# Patient Record
Sex: Male | Born: 1975 | Race: White | Hispanic: No | Smoking: Former smoker
Health system: Southern US, Community
[De-identification: ages and names within clinical notes are randomized; demographics above are authoritative.]

## PROBLEM LIST (undated history)

## (undated) DIAGNOSIS — G4733 Obstructive sleep apnea (adult) (pediatric): Secondary | ICD-10-CM

## (undated) DIAGNOSIS — F4310 Post-traumatic stress disorder, unspecified: Secondary | ICD-10-CM

## (undated) DIAGNOSIS — F419 Anxiety disorder, unspecified: Secondary | ICD-10-CM

## (undated) DIAGNOSIS — G8929 Other chronic pain: Secondary | ICD-10-CM

## (undated) DIAGNOSIS — M1990 Unspecified osteoarthritis, unspecified site: Secondary | ICD-10-CM

## (undated) DIAGNOSIS — T148XXA Other injury of unspecified body region, initial encounter: Secondary | ICD-10-CM

## (undated) HISTORY — DX: Obstructive sleep apnea (adult) (pediatric): G47.33

## (undated) HISTORY — PX: APPENDECTOMY: SHX54

## (undated) HISTORY — PX: VASECTOMY: SHX75

---

## 2003-06-10 ENCOUNTER — Emergency Department (HOSPITAL_COMMUNITY): Admission: EM | Admit: 2003-06-10 | Discharge: 2003-06-10 | Payer: Self-pay | Admitting: Emergency Medicine

## 2008-02-18 ENCOUNTER — Emergency Department (HOSPITAL_COMMUNITY): Admission: EM | Admit: 2008-02-18 | Discharge: 2008-02-18 | Payer: Self-pay | Admitting: Emergency Medicine

## 2008-02-25 ENCOUNTER — Emergency Department (HOSPITAL_COMMUNITY): Admission: EM | Admit: 2008-02-25 | Discharge: 2008-02-25 | Payer: Self-pay | Admitting: Emergency Medicine

## 2008-02-29 ENCOUNTER — Emergency Department (HOSPITAL_COMMUNITY): Admission: EM | Admit: 2008-02-29 | Discharge: 2008-02-29 | Payer: Self-pay | Admitting: Emergency Medicine

## 2008-11-22 ENCOUNTER — Emergency Department (HOSPITAL_COMMUNITY): Admission: EM | Admit: 2008-11-22 | Discharge: 2008-11-22 | Payer: Self-pay | Admitting: Emergency Medicine

## 2010-10-16 LAB — COMPREHENSIVE METABOLIC PANEL
ALT: 13 U/L (ref 0–53)
AST: 20 U/L (ref 0–37)
Albumin: 3.9 g/dL (ref 3.5–5.2)
Alkaline Phosphatase: 63 U/L (ref 39–117)
BUN: 14 mg/dL (ref 6–23)
CO2: 22 mEq/L (ref 19–32)
Calcium: 8.9 mg/dL (ref 8.4–10.5)
Chloride: 110 mEq/L (ref 96–112)
Creatinine, Ser: 0.98 mg/dL (ref 0.4–1.5)
GFR calc Af Amer: >60 mL/min (ref >60)
GFR calc non Af Amer: >60 mL/min (ref >60)
Glucose, Bld: 135 mg/dL — ABNORMAL HIGH (ref 70–99)
Potassium: 4 mEq/L (ref 3.5–5.1)
Sodium: 140 mEq/L (ref 135–145)
Total Bilirubin: 0.6 mg/dL (ref 0.3–1.2)
Total Protein: 7.1 g/dL (ref 6.0–8.3)

## 2010-10-16 LAB — CBC
HCT: 48.4 % (ref 39.0–52.0)
Hemoglobin: 16.5 g/dL (ref 13.0–17.0)
MCHC: 34.1 g/dL (ref 30.0–36.0)
MCV: 89.2 fL (ref 78.0–100.0)
Platelets: 347 10*3/uL (ref 150–400)
RBC: 5.43 MIL/uL (ref 4.22–5.81)
RDW: 13 % (ref 11.5–15.5)
WBC: 18.6 10*3/uL — ABNORMAL HIGH (ref 4.0–10.5)

## 2010-10-16 LAB — DIFFERENTIAL
Basophils Absolute: 0 10*3/uL (ref 0.0–0.1)
Basophils Relative: 0 % (ref 0–1)
Eosinophils Absolute: 0 10*3/uL (ref 0.0–0.7)
Eosinophils Relative: 0 % (ref 0–5)
Lymphocytes Relative: 8 % — ABNORMAL LOW (ref 12–46)
Lymphs Abs: 1.5 10*3/uL (ref 0.7–4.0)
Monocytes Absolute: 0.3 10*3/uL (ref 0.1–1.0)
Monocytes Relative: 2 % — ABNORMAL LOW (ref 3–12)
Neutro Abs: 16.8 10*3/uL — ABNORMAL HIGH (ref 1.7–7.7)
Neutrophils Relative %: 90 % — ABNORMAL HIGH (ref 43–77)

## 2010-10-16 LAB — LIPASE, BLOOD: Lipase: 18 U/L (ref 11–59)

## 2013-03-02 ENCOUNTER — Encounter (HOSPITAL_COMMUNITY): Payer: Self-pay | Admitting: Emergency Medicine

## 2013-03-02 ENCOUNTER — Emergency Department (HOSPITAL_COMMUNITY)
Admission: EM | Admit: 2013-03-02 | Discharge: 2013-03-02 | Disposition: A | Discharge status: Home / Self Care | Attending: Emergency Medicine | Admitting: Emergency Medicine

## 2013-03-02 DIAGNOSIS — H6690 Otitis media, unspecified, unspecified ear: Secondary | ICD-10-CM | POA: Insufficient documentation

## 2013-03-02 DIAGNOSIS — Z79899 Other long term (current) drug therapy: Secondary | ICD-10-CM | POA: Insufficient documentation

## 2013-03-02 DIAGNOSIS — Z87891 Personal history of nicotine dependence: Secondary | ICD-10-CM | POA: Insufficient documentation

## 2013-03-02 DIAGNOSIS — F4310 Post-traumatic stress disorder, unspecified: Secondary | ICD-10-CM | POA: Insufficient documentation

## 2013-03-02 DIAGNOSIS — Z8739 Personal history of other diseases of the musculoskeletal system and connective tissue: Secondary | ICD-10-CM | POA: Insufficient documentation

## 2013-03-02 DIAGNOSIS — H6692 Otitis media, unspecified, left ear: Secondary | ICD-10-CM

## 2013-03-02 DIAGNOSIS — H9190 Unspecified hearing loss, unspecified ear: Secondary | ICD-10-CM | POA: Insufficient documentation

## 2013-03-02 HISTORY — DX: Unspecified osteoarthritis, unspecified site: M19.90

## 2013-03-02 HISTORY — DX: Post-traumatic stress disorder, unspecified: F43.10

## 2013-03-02 MED ORDER — AMOXICILLIN 500 MG PO CAPS
1000.0000 mg | ORAL_CAPSULE | Freq: Once | ORAL | Status: AC
  Administered 2013-03-02: 1000 mg via ORAL
  Filled 2013-03-02: qty 2

## 2013-03-02 MED ORDER — ANTIPYRINE-BENZOCAINE 5.4-1.4 % OT SOLN
3.0000 [drp] | Freq: Once | OTIC | Status: AC
  Administered 2013-03-02: 4 [drp] via OTIC
  Filled 2013-03-02: qty 10

## 2013-03-02 MED ORDER — AMOXICILLIN 500 MG PO CAPS: 1000.0000 mg | ORAL_CAPSULE | Freq: Two times a day (BID) | ORAL | Status: DC

## 2013-03-02 NOTE — ED Notes (Signed)
Patient states he woke up this morning and was having pressure behind bilateral ears.   Patient states that "I'm not hearing right.  I can't hear you good right now".   Patient has service dog at bedside with him.

## 2013-03-02 NOTE — ED Provider Notes (Signed)
CSN: [id number]     Arrival date & time 03/02/13  4696 History   First MD Initiated Contact with Patient 03/02/13 [phone]     Chief Complaint  Patient presents with  . Otalgia   (Consider location/radiation/quality/duration/timing/severity/associated sxs/prior Treatment) Patient is a 37 y.o. male presenting with ear pain. The history is provided by the patient. No language interpreter was used.  Otalgia Location:  Bilateral Severity:  Moderate Associated symptoms: hearing loss   Associated symptoms: no abdominal pain, no cough, no fever, no headaches and no rash   Associated symptoms comment:  Bilateral ear pain, worse on left, for several days. No known fever at home. He does not have any difficulty swallowing or significant sore throat. He complains of mild nasal and sinus congestion and muffled hearing. He is dizzy when he walks and feels off balance. There has been no drainage or bleeding from the ears.    Past Medical History  Diagnosis Date  . Arthritis   . PTSD (post-traumatic stress disorder)    History reviewed. No pertinent past surgical history. No family history on file. History  Substance Use Topics  . Smoking status: Former Games developer  . Smokeless tobacco: Not on file  . Alcohol Use: No    Review of Systems  Constitutional: Negative for fever.  HENT: Positive for hearing loss and ear pain.   Respiratory: Negative for cough.   Gastrointestinal: Negative for nausea and abdominal pain.  Musculoskeletal: Negative for myalgias.  Skin: Negative for rash.  Neurological: Negative for headaches.  Psychiatric/Behavioral: Negative for confusion.    Allergies  Benadryl  Home Medications   Current Outpatient Rx  Name  Route  Sig  Dispense  Refill  . ALPRAZolam (XANAX) 1 MG tablet   Oral   Take 1 mg by mouth 2 (two) times daily as needed for sleep or anxiety.         Marland Kitchen buPROPion (WELLBUTRIN SR) 150 MG 12 hr tablet   Oral   Take 150 mg by mouth 2 (two) times daily.        Marland Kitchen desvenlafaxine (PRISTIQ) 100 MG 24 hr tablet   Oral   Take 200 mg by mouth daily.         Marland Kitchen gabapentin (NEURONTIN) 600 MG tablet   Oral   Take 600 mg by mouth 2 (two) times daily.         Marland Kitchen morphine (MSIR) 30 MG tablet   Oral   Take 30 mg by mouth 3 (three) times daily as needed for pain.         Marland Kitchen zolpidem (AMBIEN) 10 MG tablet   Oral   Take 10 mg by mouth at bedtime.          BP 147/99  Pulse 103  Temp(Src) 98.7 F (37.1 C) (Oral)  Resp 16  Ht 6' (1.829 m)  Wt 160 lb (72.576 kg)  BMI 21.7 kg/m2  SpO2 97% Physical Exam  Constitutional: He appears well-developed and well-nourished.  HENT:  Head: Normocephalic.  Mouth/Throat: Oropharynx is clear and moist.  Left TM significantly erythematous, slight bulging. External canal unremarkable. Right TM and external canal without acute finding.   Neck: Normal range of motion. Neck supple.  Cardiovascular: Normal rate and regular rhythm.   Pulmonary/Chest: Effort normal and breath sounds normal.  Abdominal: Soft. Bowel sounds are normal. There is no tenderness. There is no rebound and no guarding.  Musculoskeletal: Normal range of motion.  Lymphadenopathy:       Head (  right side): No preauricular and no posterior auricular adenopathy present.       Head (left side): No preauricular and no posterior auricular adenopathy present.  Neurological: He is alert. No cranial nerve deficit.  Skin: Skin is warm and dry. No rash noted.  Psychiatric: He has a normal mood and affect.    ED Course  Procedures (including critical care time) Labs Review Labs Reviewed - No data to display Imaging Review No results found.  MDM  No diagnosis found. 1. Otitis media, left  Auralgen given for comfort, patient started on Amoxil. [facility]    Arnoldo Hooker, PA-C 03/05/13 2306

## 2013-03-09 NOTE — ED Provider Notes (Signed)
Medical screening examination/treatment/procedure(s) were performed by non-physician practitioner and as supervising physician I was immediately available for consultation/collaboration.   Laray Anger, DO 03/09/13 1458

## 2013-03-13 ENCOUNTER — Emergency Department (HOSPITAL_COMMUNITY)
Admission: EM | Admit: 2013-03-13 | Discharge: 2013-03-13 | Disposition: A | Discharge status: Home / Self Care | Attending: Emergency Medicine | Admitting: Emergency Medicine

## 2013-03-13 ENCOUNTER — Encounter (HOSPITAL_COMMUNITY): Payer: Self-pay | Admitting: Emergency Medicine

## 2013-03-13 DIAGNOSIS — Z8739 Personal history of other diseases of the musculoskeletal system and connective tissue: Secondary | ICD-10-CM | POA: Insufficient documentation

## 2013-03-13 DIAGNOSIS — Z79899 Other long term (current) drug therapy: Secondary | ICD-10-CM | POA: Insufficient documentation

## 2013-03-13 DIAGNOSIS — H9209 Otalgia, unspecified ear: Secondary | ICD-10-CM | POA: Insufficient documentation

## 2013-03-13 DIAGNOSIS — Z87891 Personal history of nicotine dependence: Secondary | ICD-10-CM | POA: Insufficient documentation

## 2013-03-13 DIAGNOSIS — R000 Tachycardia, unspecified: Secondary | ICD-10-CM | POA: Insufficient documentation

## 2013-03-13 DIAGNOSIS — N342 Other urethritis: Secondary | ICD-10-CM | POA: Insufficient documentation

## 2013-03-13 DIAGNOSIS — Z792 Long term (current) use of antibiotics: Secondary | ICD-10-CM | POA: Insufficient documentation

## 2013-03-13 DIAGNOSIS — F4310 Post-traumatic stress disorder, unspecified: Secondary | ICD-10-CM | POA: Insufficient documentation

## 2013-03-13 DIAGNOSIS — J029 Acute pharyngitis, unspecified: Secondary | ICD-10-CM

## 2013-03-13 LAB — URINALYSIS, ROUTINE W REFLEX MICROSCOPIC
Bilirubin Urine: NEGATIVE
Glucose, UA: NEGATIVE mg/dL
Hgb urine dipstick: NEGATIVE
Ketones, ur: NEGATIVE mg/dL
Leukocytes, UA: NEGATIVE
Nitrite: NEGATIVE
Protein, ur: NEGATIVE mg/dL
Specific Gravity, Urine: 1.01 (ref 1.005–1.030)
Urobilinogen, UA: 1 mg/dL (ref 0.0–1.0)
pH: 7.5 (ref 5.0–8.0)

## 2013-03-13 LAB — POCT I-STAT, CHEM 8
BUN: <3 mg/dL — ABNORMAL LOW (ref 6–23)
Calcium, Ion: 1.17 mmol/L (ref 1.12–1.23)
Chloride: 101 mEq/L (ref 96–112)
Creatinine, Ser: 1.2 mg/dL (ref 0.50–1.35)
Glucose, Bld: 119 mg/dL — ABNORMAL HIGH (ref 70–99)
HCT: 46 % (ref 39.0–52.0)
Hemoglobin: 15.6 g/dL (ref 13.0–17.0)
Potassium: 3.5 mEq/L (ref 3.5–5.1)
Sodium: 140 mEq/L (ref 135–145)
TCO2: 24 mmol/L (ref 0–100)

## 2013-03-13 LAB — RAPID STREP SCREEN (MED CTR MEBANE ONLY): Streptococcus, Group A Screen (Direct): NEGATIVE

## 2013-03-13 MED ORDER — AZITHROMYCIN 250 MG PO TABS
1000.0000 mg | ORAL_TABLET | Freq: Once | ORAL | Status: AC
  Administered 2013-03-13: 1000 mg via ORAL
  Filled 2013-03-13: qty 4

## 2013-03-13 MED ORDER — SODIUM CHLORIDE 0.9 % IV BOLUS (SEPSIS)
1000.0000 mL | Freq: Once | INTRAVENOUS | Status: AC
  Administered 2013-03-13: 1000 mL via INTRAVENOUS

## 2013-03-13 MED ORDER — CEFTRIAXONE SODIUM 250 MG IJ SOLR
250.0000 mg | Freq: Once | INTRAMUSCULAR | Status: AC
  Administered 2013-03-13: 250 mg via INTRAMUSCULAR
  Filled 2013-03-13: qty 250

## 2013-03-13 MED ORDER — LIDOCAINE HCL (PF) 1 % IJ SOLN
INTRAMUSCULAR | Status: AC
  Administered 2013-03-13: 0.9 mL
  Filled 2013-03-13: qty 5

## 2013-03-13 NOTE — ED Notes (Addendum)
States has been on Amoxicillan since Aug 26 when he was seen here for inner ear infection. States has swollen glands and right ear still hurts. ALSO -- HAS SERVICE DOG AT BEDSIDE

## 2013-03-13 NOTE — ED Notes (Signed)
Assumed Care. Initial contact. Patient resting with dog at bedside. Appears in NAD. Medicated per MAR.

## 2013-03-13 NOTE — ED Notes (Signed)
Medicated per MAR. Tolerated well.

## 2013-03-13 NOTE — ED Notes (Signed)
Patient was dc by Adrian Prows, RN.

## 2013-03-13 NOTE — ED Provider Notes (Signed)
CSN: [id number]     Arrival date & time 03/13/13  4098 History   First MD Initiated Contact with Patient 03/13/13 [phone]     Chief Complaint  Patient presents with  . Otalgia  . Sore Throat   (Consider location/radiation/quality/duration/timing/severity/associated sxs/prior Treatment) HPI 37 year old male who comes in today complaining of sore throat and right ear pain. He states that these began prior to visit 10 days ago. He states at that time he also had left ear pain. He states that the left ear pain has improved with the antibiotics but he has continued to have right ear pain. He has not had a fever with this. He states his right side of his throat is sore. He denies nausea, vomiting, decreased by mouth intake, difficulty speaking, or swallowing. He also states that he has had some burning when he urinates. He states he does not recall the number of sexual partners he has had in the past year. He denies any definite history of sexually transmitted diseases or urethral discharge. He states he has difficulty voiding to give urine samples. He states he will not be able to void for urine sample here but will require a catheterization Past Medical History  Diagnosis Date  . Arthritis   . PTSD (post-traumatic stress disorder)    History reviewed. No pertinent past surgical history. No family history on file. History  Substance Use Topics  . Smoking status: Former Games developer  . Smokeless tobacco: Not on file  . Alcohol Use: No    Review of Systems  All other systems reviewed and are negative.    Allergies  Review of patient's allergies indicates no active allergies.  Home Medications   Current Outpatient Rx  Name  Route  Sig  Dispense  Refill  . ALPRAZolam (XANAX) 1 MG tablet   Oral   Take 1 mg by mouth 3 (three) times daily as needed for sleep or anxiety.          Marland Kitchen amoxicillin (AMOXIL) 500 MG capsule   Oral   Take 2 capsules (1,000 mg total) by mouth 2 (two) times daily.   40  capsule   0   . buPROPion (WELLBUTRIN SR) 150 MG 12 hr tablet   Oral   Take 150 mg by mouth 2 (two) times daily.         Marland Kitchen desvenlafaxine (PRISTIQ) 100 MG 24 hr tablet   Oral   Take 200 mg by mouth daily.         Marland Kitchen gabapentin (NEURONTIN) 600 MG tablet   Oral   Take 600 mg by mouth 2 (two) times daily.         Marland Kitchen HYDROmorphone (DILAUDID) 2 MG tablet   Oral   Take 2 mg by mouth every 4 (four) hours as needed for pain.         Marland Kitchen morphine (MSIR) 30 MG tablet   Oral   Take 30 mg by mouth 3 (three) times daily as needed for pain.         Marland Kitchen zolpidem (AMBIEN) 10 MG tablet   Oral   Take 10 mg by mouth at bedtime.          BP 155/97  Pulse 110  Temp(Src) 98.7 F (37.1 C) (Oral)  Resp 18  SpO2 98% Physical Exam  Nursing note and vitals reviewed. Constitutional: He is oriented to person, place, and time. He appears well-developed and well-nourished.  HENT:  Head: Normocephalic and atraumatic.  Right Ear:  External ear normal.  Left Ear: External ear normal.  Nose: Nose normal.  Mouth/Throat: Oropharynx is clear and moist.  Eyes: Conjunctivae are normal. Pupils are equal, round, and reactive to light.  Neck: Normal range of motion.  Cardiovascular: Regular rhythm, normal heart sounds and intact distal pulses.  Tachycardia present.   Heart rate 112 by auscultation.  Pulmonary/Chest: Effort normal.  Abdominal: Soft. Bowel sounds are normal.  Genitourinary: Penis normal.  Patient with transurethral bar/piercing in place. No urethral discharge is noted. There is no swelling, warmth, or mass palpable.  Musculoskeletal: Normal range of motion.  Neurological: He is alert and oriented to person, place, and time. He has normal reflexes.  Skin: Skin is warm and dry.  Psychiatric: He has a normal mood and affect. His behavior is normal. Thought content normal.    ED Course  Procedures (including critical care time) Labs Review Labs Reviewed  POCT I-STAT, CHEM 8 -  Abnormal; Notable for the following:    BUN <3 (*)    Glucose, Bld 119 (*)    All other components within normal limits  RAPID STREP SCREEN  GC/CHLAMYDIA PROBE AMP  URINALYSIS, ROUTINE W REFLEX MICROSCOPIC   Imaging Review No results found.  MDM  Patient had a catheterized urine obtained as he states that he has dysuria but is unable to voluntarily give urinary specimen. He states he would be unable to void for urinary specimen unit he was given IV fluids and has always required a catheterization. Patient had lab work checked do to his tachycardia with his symptoms. His creatinine is normal and he does not appear volume depleted by labs or physical exam. The tachycardia has been consistent and he is not hypotensive.  Results for orders placed during the hospital encounter of 03/13/13  URINALYSIS, ROUTINE W REFLEX MICROSCOPIC      Result Value Range   Color, Urine YELLOW  YELLOW   APPearance CLEAR  CLEAR   Specific Gravity, Urine 1.010  1.005 - 1.030   pH 7.5  5.0 - 8.0   Glucose, UA NEGATIVE  NEGATIVE mg/dL   Hgb urine dipstick NEGATIVE  NEGATIVE   Bilirubin Urine NEGATIVE  NEGATIVE   Ketones, ur NEGATIVE  NEGATIVE mg/dL   Protein, ur NEGATIVE  NEGATIVE mg/dL   Urobilinogen, UA 1.0  0.0 - 1.0 mg/dL   Nitrite NEGATIVE  NEGATIVE   Leukocytes, UA NEGATIVE  NEGATIVE  POCT I-STAT, CHEM 8      Result Value Range   Sodium 140  135 - 145 mEq/L   Potassium 3.5  3.5 - 5.1 mEq/L   Chloride 101  96 - 112 mEq/L   BUN <3 (*) 6 - 23 mg/dL   Creatinine, Ser 1.47  0.50 - 1.35 mg/dL   Glucose, Bld 829 (*) 70 - 99 mg/dL   Calcium, Ion 5.62  1.30 - 1.23 mmol/L   TCO2 24  0 - 100 mmol/L   Hemoglobin 15.6  13.0 - 17.0 g/dL   HCT 86.5  78.4 - 69.6 %   Filed Vitals:   03/13/13 1206  BP: 152/95  Pulse: 89  Temp:   Resp: 16     Patient feels better and hr decreased.   1- sore throat/ear pain- He is taking po without difficulty and has normal speech.  No evidence of abscess.. I am waiting  results of strep.  2-urethritis- gc/chlamydia sent andpatient treated with rocephin and zithrommax   Hilario Quarry, MD 03/13/13 1234

## 2013-03-15 LAB — GC/CHLAMYDIA PROBE AMP
CT Probe RNA: NEGATIVE
GC Probe RNA: NEGATIVE

## 2013-03-15 LAB — CULTURE, GROUP A STREP

## 2013-07-26 ENCOUNTER — Emergency Department (HOSPITAL_COMMUNITY)

## 2013-07-26 ENCOUNTER — Emergency Department (HOSPITAL_COMMUNITY)
Admission: EM | Admit: 2013-07-26 | Discharge: 2013-07-26 | Disposition: A | Discharge status: Home / Self Care | Attending: Emergency Medicine | Admitting: Emergency Medicine

## 2013-07-26 ENCOUNTER — Encounter (HOSPITAL_COMMUNITY): Payer: Self-pay | Admitting: Emergency Medicine

## 2013-07-26 DIAGNOSIS — Z8739 Personal history of other diseases of the musculoskeletal system and connective tissue: Secondary | ICD-10-CM | POA: Insufficient documentation

## 2013-07-26 DIAGNOSIS — Z87891 Personal history of nicotine dependence: Secondary | ICD-10-CM | POA: Insufficient documentation

## 2013-07-26 DIAGNOSIS — S90129A Contusion of unspecified lesser toe(s) without damage to nail, initial encounter: Secondary | ICD-10-CM | POA: Insufficient documentation

## 2013-07-26 DIAGNOSIS — Z79899 Other long term (current) drug therapy: Secondary | ICD-10-CM | POA: Insufficient documentation

## 2013-07-26 DIAGNOSIS — W208XXA Other cause of strike by thrown, projected or falling object, initial encounter: Secondary | ICD-10-CM | POA: Insufficient documentation

## 2013-07-26 DIAGNOSIS — F4310 Post-traumatic stress disorder, unspecified: Secondary | ICD-10-CM | POA: Insufficient documentation

## 2013-07-26 DIAGNOSIS — S90111A Contusion of right great toe without damage to nail, initial encounter: Secondary | ICD-10-CM

## 2013-07-26 DIAGNOSIS — Y929 Unspecified place or not applicable: Secondary | ICD-10-CM | POA: Insufficient documentation

## 2013-07-26 DIAGNOSIS — Y9389 Activity, other specified: Secondary | ICD-10-CM | POA: Insufficient documentation

## 2013-07-26 MED ORDER — OXYCODONE-ACETAMINOPHEN 5-325 MG PO TABS
2.0000 | ORAL_TABLET | Freq: Once | ORAL | Status: AC
  Administered 2013-07-26: 2 via ORAL
  Filled 2013-07-26: qty 2

## 2013-07-26 NOTE — ED Notes (Signed)
Pt refused post op shoes.

## 2013-07-26 NOTE — Discharge Instructions (Signed)
Contusion A contusion is a deep bruise. Contusions happen when an injury causes bleeding under the skin. Signs of bruising include pain, puffiness (swelling), and discolored skin. The contusion may turn blue, purple, or yellow. HOME CARE   Put ice on the injured area.  Put ice in a plastic bag.  Place a towel between your skin and the bag.  Leave the ice on for 15-20 minutes, 03-04 times a day.  Only take medicine as told by your doctor.  Rest the injured area.  If possible, raise (elevate) the injured area to lessen puffiness. GET HELP RIGHT AWAY IF:   You have more bruising or puffiness.  You have pain that is getting worse.  Your puffiness or pain is not helped by medicine. MAKE SURE YOU:   Understand these instructions.  Will watch your condition.  Will get help right away if you are not doing well or get worse. Document Released: 12/11/2007 Document Revised: 09/16/2011 Document Reviewed: 04/29/2011 [facility] Patient Information 2014 [place], [place].  Buddy Taping of Toes We have taped your toes together to keep them from moving. This is called "buddy taping" since we used a part of your own body to keep the injured part still. We placed soft padding between your toes to keep them from rubbing against each other. Buddy taping will help with healing and to reduce pain. Keep your toes buddy taped together for as long as directed by your caregiver. HOME CARE INSTRUCTIONS   Raise your injured area above the level of your heart while sitting or lying down. Prop it up with pillows.  An ice pack used every twenty minutes, while awake, for the first one to two days may be helpful. Put ice in a plastic bag and put a towel between the bag and your skin.  Watch for signs that the taping is too tight. These signs may be:  Numbness of your taped toes.  Coolness of your taped toes.  Color change in the area beyond the tape.  Increased pain.  If you have any of these signs,  loosen or rewrap the tape. If you need to loosen or rewrap the buddy tape, make sure you use the padding again. SEEK IMMEDIATE MEDICAL CARE IF:   You have worse pain, swelling, inflammation (soreness), drainage or bleeding after you rewrap the tape.  Any new problems occur. MAKE SURE YOU:   Understand these instructions.  Will watch your condition.  Will get help right away if you are not doing well or get worse. Document Released: 03/28/2004 Document Revised: 09/16/2011 Document Reviewed: 06/21/2008 [facility] Patient Information 2014 [place], [place]. RICE: Routine Care for Injuries Rest, Ice, Compression, and Elevation (RICE) are often used to care for injuries. HOME CARE  Rest your injury.  Put ice on the injury.  Put ice in a plastic bag.  Place a towel between your skin and the bag.  Leave the ice on for 15-20 minutes, 03-04 times a day. Do this for as long as told by your doctor.  Apply pressure (compression) with an elastic bandage. Remove and reapply the bandage every 3 to 4 hours. Do not wrap the bandage too tight. Wrap the bandage looser if the fingers or toes are puffy (swollen), blue, cold, painful, or lose feeling (numb).  Raise (elevate) your injury. Raise your injury above the heart if you can. GET HELP RIGHT AWAY IF:  You have lasting pain or puffiness.  Your injury is red, weak, or loses feeling.  Your problems get  worse, not better, after several days. MAKE SURE YOU:  Understand these instructions.  Will watch your condition.  Will get help right away if you are not doing well or get worse. Document Released: 12/11/2007 Document Revised: 09/16/2011 Document Reviewed: 11/23/2010 [facility] Patient Information 2014 [place]

## 2013-07-26 NOTE — ED Provider Notes (Signed)
CSN: [id number]     Arrival date & time 07/26/13  1404 History  This chart was scribed for non-physician practitioner, Antony Madura, PA-C,working with Hurman Horn, MD, by Karle Plumber, ED Scribe.  This patient was seen in room TR09C/TR09C and the patient's care was started at 3:44 PM.  Chief Complaint  Patient presents with  . Toe Pain   The history is provided by the patient. No language interpreter was used.   HPI Comments:  Martin Daniel is a 38 y.o. male who presents to the Emergency Department complaining of a right toe injury secondary to dropping a washing machine on his great toe approximately two hours ago. Pt describes the pain as a sharp, throbbing pain. Pt denies taking anything prior to arrival for pain. Pt denies numbness or tingling of the toe. He denies foot or ankle pain.     Past Medical History  Diagnosis Date  . Arthritis   . PTSD (post-traumatic stress disorder)    Past Surgical History  Procedure Laterality Date  . Vasectomy     No family history on file. History  Substance Use Topics  . Smoking status: Former Games developer  . Smokeless tobacco: Not on file  . Alcohol Use: No    Review of Systems  Musculoskeletal: Positive for arthralgias.  Neurological: Negative for numbness.  All other systems reviewed and are negative.    Allergies  Review of patient's allergies indicates no known allergies.  Home Medications   Current Outpatient Rx  Name  Route  Sig  Dispense  Refill  . ALPRAZolam (XANAX) 1 MG tablet   Oral   Take 1 mg by mouth 3 (three) times daily as needed for sleep or anxiety.          . ARIPiprazole (ABILIFY) 20 MG tablet   Oral   Take 10 mg by mouth every morning.         . desvenlafaxine (PRISTIQ) 100 MG 24 hr tablet   Oral   Take 300 mg by mouth daily.          Marland Kitchen gabapentin (NEURONTIN) 600 MG tablet   Oral   Take 600 mg by mouth 2 (two) times daily.         Marland Kitchen HYDROmorphone (DILAUDID) 4 MG tablet   Oral   Take 4 mg  by mouth every 4 (four) hours as needed (pain).         Marland Kitchen zolpidem (AMBIEN) 10 MG tablet   Oral   Take 10 mg by mouth at bedtime.          Triage Vitals: BP 137/74  Pulse 102  Temp(Src) 97.9 F (36.6 C) (Oral)  Resp 18  Wt 165 lb (74.844 kg)  SpO2 99%  Physical Exam  Nursing note and vitals reviewed. Constitutional: He is oriented to person, place, and time. He appears well-developed and well-nourished. No distress.  HENT:  Head: Normocephalic and atraumatic.  Eyes: Conjunctivae and EOM are normal. No scleral icterus.  Neck: Normal range of motion.  Cardiovascular: Normal rate, regular rhythm and intact distal pulses.   Pulses:      Dorsalis pedis pulses are 2+ on the right side.       Posterior tibial pulses are 2+ on the right side.  Capillary refill normal  Pulmonary/Chest: Effort normal. No respiratory distress.  Musculoskeletal: Normal range of motion. He exhibits tenderness.       Right ankle: Normal.       Right foot: He exhibits  tenderness and bony tenderness. He exhibits normal range of motion, normal capillary refill, no crepitus, no deformity and no laceration.       Feet:  TTP of DIP joint of great toe. No TTP of 1st MTP joint. No obvious deformity noted. Nailbed intact.  Neurological: He is alert and oriented to person, place, and time.  No numbness, tingling or weakness of the affected toe; gross sensation intact.  Skin: Skin is warm and dry. No rash noted. He is not diaphoretic. No erythema. No pallor.  Psychiatric: He has a normal mood and affect. His behavior is normal.    ED Course  Procedures (including critical care time) DIAGNOSTIC STUDIES: Oxygen Saturation is 99% on RA, normal by my interpretation.   COORDINATION OF CARE: 3:48 PM- Will X-Ray great, right toe. Pt verbalizes understanding and agrees to plan.  Medications  oxyCODONE-acetaminophen (PERCOCET/ROXICET) 5-325 MG per tablet 2 tablet (2 tablets Oral Given 07/26/13 1554)    Labs  Review Labs Reviewed - No data to display Imaging Review Dg Foot Complete Right  07/26/2013   CLINICAL DATA:  Right great toe pain post injury  EXAM: RIGHT FOOT COMPLETE - 3+ VIEW  COMPARISON:  None.  FINDINGS: Three views of the right foot submitted. No acute fracture or subluxation. No radiopaque foreign body.  IMPRESSION: Negative.   Electronically Signed   By: Natasha MeadLiviu  Pop M.D.   On: 07/26/2013 16:30    EKG Interpretation   None       MDM   1. Contusion of great toe, right    Uncomplicated contusion of right great toe. Patient neurovascularly intact in physical exam. He is ambulatory in the ED. No gross sensory deficits appreciated. Capillary refill normal. No evidence of open wound and x-ray negative for fracture. Patient placed in postop shoe. He is stable for discharge with RICE instruction. Already on 4mg  Dilaudid pills q 4 hours PRN. Orthopedic referral provided should the patient need to follow up. Return precautions provided and patient agreeable to plan with no unaddressed concerns.  I personally performed the services described in this documentation, which was scribed in my presence. The recorded information has been reviewed and is accurate.    Antony MaduraKelly Chuck Caban, PA-C 07/26/13 [id number]

## 2013-07-26 NOTE — ED Notes (Signed)
Pt dropped a washer on his shoed-foot.  Redness and swelling noted.  Some active bleeding.

## 2013-07-26 NOTE — ED Notes (Signed)
Pt discharged.Vital signs stable and GCS 15.Educated on wound care.Pt on chronic pain medication pain .

## 2013-07-29 NOTE — ED Provider Notes (Signed)
Medical screening examination/treatment/procedure(s) were performed by non-physician practitioner and as supervising physician I was immediately available for consultation/collaboration.  Keala Drum M Micki Cassel, MD 07/29/13 1841 

## 2014-03-06 ENCOUNTER — Emergency Department (HOSPITAL_COMMUNITY)

## 2014-03-06 ENCOUNTER — Inpatient Hospital Stay (HOSPITAL_COMMUNITY)
Admission: EM | Admit: 2014-03-06 | Discharge: 2014-03-08 | DRG: 917 | Disposition: A | Discharge status: Home / Self Care | Attending: Internal Medicine | Admitting: Internal Medicine

## 2014-03-06 ENCOUNTER — Encounter (HOSPITAL_COMMUNITY): Payer: Self-pay | Admitting: Emergency Medicine

## 2014-03-06 DIAGNOSIS — F4310 Post-traumatic stress disorder, unspecified: Secondary | ICD-10-CM | POA: Diagnosis present

## 2014-03-06 DIAGNOSIS — G4700 Insomnia, unspecified: Secondary | ICD-10-CM | POA: Diagnosis present

## 2014-03-06 DIAGNOSIS — R4182 Altered mental status, unspecified: Secondary | ICD-10-CM | POA: Insufficient documentation

## 2014-03-06 DIAGNOSIS — G9340 Encephalopathy, unspecified: Secondary | ICD-10-CM | POA: Diagnosis present

## 2014-03-06 DIAGNOSIS — F411 Generalized anxiety disorder: Secondary | ICD-10-CM | POA: Diagnosis present

## 2014-03-06 DIAGNOSIS — Z87891 Personal history of nicotine dependence: Secondary | ICD-10-CM

## 2014-03-06 DIAGNOSIS — F329 Major depressive disorder, single episode, unspecified: Secondary | ICD-10-CM | POA: Diagnosis present

## 2014-03-06 DIAGNOSIS — R892 Abnormal level of other drugs, medicaments and biological substances in specimens from other organs, systems and tissues: Secondary | ICD-10-CM

## 2014-03-06 DIAGNOSIS — F3289 Other specified depressive episodes: Secondary | ICD-10-CM | POA: Diagnosis present

## 2014-03-06 DIAGNOSIS — M129 Arthropathy, unspecified: Secondary | ICD-10-CM | POA: Diagnosis present

## 2014-03-06 DIAGNOSIS — T426X1A Poisoning by other antiepileptic and sedative-hypnotic drugs, accidental (unintentional), initial encounter: Principal | ICD-10-CM | POA: Diagnosis present

## 2014-03-06 DIAGNOSIS — F1110 Opioid abuse, uncomplicated: Secondary | ICD-10-CM

## 2014-03-06 DIAGNOSIS — R400 Somnolence: Secondary | ICD-10-CM

## 2014-03-06 DIAGNOSIS — R404 Transient alteration of awareness: Secondary | ICD-10-CM | POA: Diagnosis present

## 2014-03-06 HISTORY — DX: Other injury of unspecified body region, initial encounter: T14.8XXA

## 2014-03-06 HISTORY — DX: Anxiety disorder, unspecified: F41.9

## 2014-03-06 LAB — I-STAT ARTERIAL BLOOD GAS, ED
Acid-Base Excess: 3 mmol/L — ABNORMAL HIGH (ref 0.0–2.0)
BICARBONATE: 27.8 meq/L — AB (ref 20.0–24.0)
O2 Saturation: 95 %
TCO2: 29 mmol/L (ref 0–100)
pCO2 arterial: 41.5 mmHg (ref 35.0–45.0)
pH, Arterial: 7.434 (ref 7.350–7.450)
pO2, Arterial: 75 mmHg — ABNORMAL LOW (ref 80.0–100.0)

## 2014-03-06 LAB — CBC
HEMATOCRIT: 51.7 % (ref 39.0–52.0)
HEMOGLOBIN: 18 g/dL — AB (ref 13.0–17.0)
MCH: 30.9 pg (ref 26.0–34.0)
MCHC: 34.8 g/dL (ref 30.0–36.0)
MCV: 88.7 fL (ref 78.0–100.0)
Platelets: 271 10*3/uL (ref 150–400)
RBC: 5.83 MIL/uL — ABNORMAL HIGH (ref 4.22–5.81)
RDW: 13.3 % (ref 11.5–15.5)
WBC: 6.8 10*3/uL (ref 4.0–10.5)

## 2014-03-06 LAB — URINALYSIS, ROUTINE W REFLEX MICROSCOPIC
BILIRUBIN URINE: NEGATIVE
Glucose, UA: NEGATIVE mg/dL
HGB URINE DIPSTICK: NEGATIVE
KETONES UR: 15 mg/dL — AB
Leukocytes, UA: NEGATIVE
Nitrite: NEGATIVE
Protein, ur: NEGATIVE mg/dL
Specific Gravity, Urine: 1.023 (ref 1.005–1.030)
UROBILINOGEN UA: 1 mg/dL (ref 0.0–1.0)
pH: 7 (ref 5.0–8.0)

## 2014-03-06 LAB — RAPID URINE DRUG SCREEN, HOSP PERFORMED
Amphetamines: NOT DETECTED
BARBITURATES: NOT DETECTED
Benzodiazepines: POSITIVE — AB
Cocaine: NOT DETECTED
Opiates: NOT DETECTED
TETRAHYDROCANNABINOL: NOT DETECTED

## 2014-03-06 LAB — COMPREHENSIVE METABOLIC PANEL
ALK PHOS: 48 U/L (ref 39–117)
ALT: 15 U/L (ref 0–53)
AST: 17 U/L (ref 0–37)
Albumin: 3.9 g/dL (ref 3.5–5.2)
Anion gap: 13 (ref 5–15)
BUN: 8 mg/dL (ref 6–23)
CO2: 29 mEq/L (ref 19–32)
Calcium: 9.6 mg/dL (ref 8.4–10.5)
Chloride: 100 mEq/L (ref 96–112)
Creatinine, Ser: 1.33 mg/dL (ref 0.50–1.35)
GFR, EST AFRICAN AMERICAN: 77 mL/min — AB (ref >90)
GFR, EST NON AFRICAN AMERICAN: 66 mL/min — AB (ref >90)
GLUCOSE: 99 mg/dL (ref 70–99)
POTASSIUM: 3.7 meq/L (ref 3.7–5.3)
SODIUM: 142 meq/L (ref 137–147)
Total Bilirubin: 0.5 mg/dL (ref 0.3–1.2)
Total Protein: 7.5 g/dL (ref 6.0–8.3)

## 2014-03-06 LAB — ETHANOL

## 2014-03-06 LAB — I-STAT CG4 LACTIC ACID, ED: Lactic Acid, Venous: 2.02 mmol/L (ref 0.5–2.2)

## 2014-03-06 LAB — AMMONIA: Ammonia: 74 umol/L — ABNORMAL HIGH (ref 11–60)

## 2014-03-06 LAB — TROPONIN I

## 2014-03-06 LAB — TSH: TSH: 0.501 u[IU]/mL (ref 0.350–4.500)

## 2014-03-06 LAB — VALPROIC ACID LEVEL: VALPROIC ACID LVL: 218.6 ug/mL — AB (ref 50.0–100.0)

## 2014-03-06 LAB — MRSA PCR SCREENING: MRSA BY PCR: NEGATIVE

## 2014-03-06 MED ORDER — LORAZEPAM 2 MG/ML IJ SOLN
0.5000 mg | Freq: Four times a day (QID) | INTRAMUSCULAR | Status: DC | PRN
  Administered 2014-03-07: 0.5 mg via INTRAVENOUS
  Filled 2014-03-06: qty 1

## 2014-03-06 MED ORDER — SODIUM CHLORIDE 0.9 % IV SOLN: INTRAVENOUS | Status: AC

## 2014-03-06 MED ORDER — SODIUM CHLORIDE 0.9 % IV BOLUS (SEPSIS)
1000.0000 mL | Freq: Once | INTRAVENOUS | Status: AC
  Administered 2014-03-06: 1000 mL via INTRAVENOUS

## 2014-03-06 MED ORDER — PROMETHAZINE HCL 25 MG/ML IJ SOLN: 12.5000 mg | Freq: Four times a day (QID) | INTRAMUSCULAR | Status: DC | PRN

## 2014-03-06 MED ORDER — ALBUTEROL SULFATE (2.5 MG/3ML) 0.083% IN NEBU
2.5000 mg | INHALATION_SOLUTION | RESPIRATORY_TRACT | Status: DC | PRN
Start: 2014-03-06 — End: 2014-03-08

## 2014-03-06 MED ORDER — SODIUM CHLORIDE 0.9 % IV SOLN
INTRAVENOUS | Status: DC
  Administered 2014-03-06 – 2014-03-08 (×5): via INTRAVENOUS

## 2014-03-06 MED ORDER — HYDROMORPHONE HCL PF 1 MG/ML IJ SOLN: 1.0000 mg | Freq: Two times a day (BID) | INTRAMUSCULAR | Status: DC | PRN

## 2014-03-06 MED ORDER — SODIUM CHLORIDE 0.9 % IJ SOLN
3.0000 mL | Freq: Two times a day (BID) | INTRAMUSCULAR | Status: DC
  Administered 2014-03-08: 3 mL via INTRAVENOUS

## 2014-03-06 MED ORDER — HEPARIN SODIUM (PORCINE) 5000 UNIT/ML IJ SOLN
5000.0000 [IU] | Freq: Three times a day (TID) | INTRAMUSCULAR | Status: DC
  Administered 2014-03-07 (×3): 5000 [IU] via SUBCUTANEOUS
  Filled 2014-03-06 (×7): qty 1

## 2014-03-06 MED ORDER — ONDANSETRON HCL 4 MG/2ML IJ SOLN: 4.0000 mg | Freq: Three times a day (TID) | INTRAMUSCULAR | Status: DC | PRN

## 2014-03-06 MED ORDER — ONDANSETRON HCL 4 MG/2ML IJ SOLN
4.0000 mg | Freq: Once | INTRAMUSCULAR | Status: AC
  Administered 2014-03-06: 4 mg via INTRAVENOUS
  Filled 2014-03-06: qty 2

## 2014-03-06 NOTE — ED Notes (Addendum)
Per [facility], pt from home for increased lethargy. Pt is veteran and girlfriend states he has been taking Dilaudid for the past 5 years and has not been able to get it refilled d/t other problems. Per girlfriend pt has been vomiting non-stop since 1600 yesterday. States he has been "out of it" since yesterday. VSS and NSR by EMS. 18g to LAC. Girlfriend states he took 3 xanax and his ambien last night before going to bed.

## 2014-03-06 NOTE — ED Provider Notes (Addendum)
CSN: [id number]     Arrival date & time 03/06/14  4098 History   First MD Initiated Contact with Patient 03/06/14 [phone]     Chief Complaint  Patient presents with  . Altered Mental Status     (Consider location/radiation/quality/duration/timing/severity/associated sxs/prior Treatment) Patient is a 38 y.o. male presenting with altered mental status. The history is provided by the EMS personnel and a significant other. The history is limited by the condition of the patient.  Altered Mental Status  level V caveat applies to the store: Information due to the patient's somnolence and unresponsiveness.  According to patient's significant other. Patient was feeling fine until about 5 in the morning on Saturday when he started complaining of a headache. Vomiting started at 4 in the afternoon on Saturday. Patient is on valproic acid. May be related to his posttraumatic stress disorder does not have a history of seizures. Patient vomited multiple times no diarrhea. Today patient was significantly less responsive. Patient was complaining of body aches before. Patient did not complain of fever blood in the vomit neck pain or abdominal pain or chest pain. Patient brought in by EMS. Patient has not had this happen before. Patient is followed entirely at the clinic [facility].  Past Medical History  Diagnosis Date  . Arthritis   . PTSD (post-traumatic stress disorder)   . Anxiety   . Nerve damage     lower abd and lower back   Past Surgical History  Procedure Laterality Date  . Vasectomy     No family history on file. History  Substance Use Topics  . Smoking status: Former Games developer  . Smokeless tobacco: Not on file  . Alcohol Use: No    Review of Systems  Unable to perform ROS  level V caveat applies to patients review of systems due to to his unresponsiveness.    Allergies  Review of patient's allergies indicates no known allergies.  Home Medications   Prior to Admission  medications   Medication Sig Start Date End Date Taking? Authorizing Provider  ALPRAZolam Prudy Feeler) 1 MG tablet Take 1 mg by mouth at bedtime.    Yes Historical Provider, MD  ARIPiprazole (ABILIFY) 20 MG tablet Take 10 mg by mouth every morning.   Yes Historical Provider, MD  Artificial Saliva AERS Use as directed 4 sprays in the mouth or throat 4 (four) times daily.   Yes Historical Provider, MD  Cholecalciferol 2000 UNITS TABS Take 1 tablet by mouth every morning.   Yes Historical Provider, MD  desvenlafaxine (PRISTIQ) 100 MG 24 hr tablet Take 300 mg by mouth daily.    Yes Historical Provider, MD  divalproex (DEPAKOTE ER) 500 MG 24 hr tablet Take 1,000 mg by mouth at bedtime.   Yes Historical Provider, MD  fluticasone (FLONASE) 50 MCG/ACT nasal spray Place into both nostrils daily.   Yes Historical Provider, MD  gabapentin (NEURONTIN) 600 MG tablet Take 600 mg by mouth 3 (three) times daily.    Yes Historical Provider, MD  HYDROmorphone (DILAUDID) 4 MG tablet Take 4 mg by mouth 2 (two) times daily.    Yes Historical Provider, MD  liothyronine (CYTOMEL) 5 MCG tablet Take 5 mcg by mouth daily.   Yes Historical Provider, MD  methylphenidate 54 MG PO CR tablet Take 54 mg by mouth every morning.   Yes Historical Provider, MD  Multiple Vitamin (MULTIVITAMIN WITH MINERALS) TABS tablet Take 1 tablet by mouth daily.   Yes Historical Provider, MD  polyvinyl alcohol (LIQUIFILM TEARS)  1.4 % ophthalmic solution Place 1 drop into both eyes 4 (four) times daily as needed for dry eyes.   Yes Historical Provider, MD  pregabalin (LYRICA) 50 MG capsule Take 50 mg by mouth 3 (three) times daily.   Yes Historical Provider, MD  testosterone cypionate (DEPOTESTOTERONE CYPIONATE) 200 MG/ML injection Inject 200 mg into the muscle every Wednesday.   Yes Historical Provider, MD  zolpidem (AMBIEN) 10 MG tablet Take 10 mg by mouth at bedtime.   Yes Historical Provider, MD   BP 128/68  Pulse 72  Temp(Src) 98.5 F (36.9 C)  (Axillary)  Resp 14  SpO2 100% Physical Exam  Nursing note and vitals reviewed. Constitutional: He appears well-developed and well-nourished. He appears distressed.  HENT:  Head: Normocephalic and atraumatic.  Mucous membranes slightly dry.  Eyes: Conjunctivae are normal. Pupils are equal, round, and reactive to light.  Pupils dilated but reactive to light and equal bilaterally.  Neck: Neck supple.  Cardiovascular: Normal rate, regular rhythm and normal heart sounds.   No murmur heard. Pulmonary/Chest: Effort normal and breath sounds normal. No respiratory distress. He has no wheezes. He has no rales.  Abdominal: Soft. Bowel sounds are normal. There is no tenderness.  Musculoskeletal: He exhibits no edema.  Patient moves extremities spontaneously but will not follow commands. No edema. Multiple skin tattoos.  Neurological:  Patient is very somnolent. Will not verbalize. Patient will move some on his own. Patient will not follow commands. Patient is swallowing normally.  Skin: Skin is warm. No rash noted. No erythema.    ED Course  Procedures (including critical care time) Labs Review Labs Reviewed  CBC - Abnormal; Notable for the following:    RBC 5.83 (*)    Hemoglobin 18.0 (*)    All other components within normal limits  COMPREHENSIVE METABOLIC PANEL - Abnormal; Notable for the following:    GFR calc non Af Amer 66 (*)    GFR calc Af Amer 77 (*)    All other components within normal limits  URINALYSIS, ROUTINE W REFLEX MICROSCOPIC - Abnormal; Notable for the following:    Ketones, ur 15 (*)    All other components within normal limits  URINE RAPID DRUG SCREEN (HOSP PERFORMED) - Abnormal; Notable for the following:    Benzodiazepines POSITIVE (*)    All other components within normal limits  VALPROIC ACID LEVEL - Abnormal; Notable for the following:    Valproic Acid Lvl 218.6 (*)    All other components within normal limits  I-STAT ARTERIAL BLOOD GAS, ED - Abnormal;  Notable for the following:    pO2, Arterial 75.0 (*)    Bicarbonate 27.8 (*)    Acid-Base Excess 3.0 (*)    All other components within normal limits  ETHANOL  CBG MONITORING, ED  I-STAT CG4 LACTIC ACID, ED   Results for orders placed during the hospital encounter of 03/06/14  CBC      Result Value Ref Range   WBC 6.8  4.0 - 10.5 K/uL   RBC 5.83 (*) 4.22 - 5.81 MIL/uL   Hemoglobin 18.0 (*) 13.0 - 17.0 g/dL   HCT 42.5  95.6 - 38.7 %   MCV 88.7  78.0 - 100.0 fL   MCH 30.9  26.0 - 34.0 pg   MCHC 34.8  30.0 - 36.0 g/dL   RDW 56.4  33.2 - 95.1 %   Platelets 271  150 - 400 K/uL  COMPREHENSIVE METABOLIC PANEL      Result Value Ref  Range   Sodium 142  137 - 147 mEq/L   Potassium 3.7  3.7 - 5.3 mEq/L   Chloride 100  96 - 112 mEq/L   CO2 29  19 - 32 mEq/L   Glucose, Bld 99  70 - 99 mg/dL   BUN 8  6 - 23 mg/dL   Creatinine, Ser 1.61  0.50 - 1.35 mg/dL   Calcium 9.6  8.4 - 09.6 mg/dL   Total Protein 7.5  6.0 - 8.3 g/dL   Albumin 3.9  3.5 - 5.2 g/dL   AST 17  0 - 37 U/L   ALT 15  0 - 53 U/L   Alkaline Phosphatase 48  39 - 117 U/L   Total Bilirubin 0.5  0.3 - 1.2 mg/dL   GFR calc non Af Amer 66 (*) >90 mL/min   GFR calc Af Amer 77 (*) >90 mL/min   Anion gap 13  5 - 15  URINALYSIS, ROUTINE W REFLEX MICROSCOPIC      Result Value Ref Range   Color, Urine YELLOW  YELLOW   APPearance CLEAR  CLEAR   Specific Gravity, Urine 1.023  1.005 - 1.030   pH 7.0  5.0 - 8.0   Glucose, UA NEGATIVE  NEGATIVE mg/dL   Hgb urine dipstick NEGATIVE  NEGATIVE   Bilirubin Urine NEGATIVE  NEGATIVE   Ketones, ur 15 (*) NEGATIVE mg/dL   Protein, ur NEGATIVE  NEGATIVE mg/dL   Urobilinogen, UA 1.0  0.0 - 1.0 mg/dL   Nitrite NEGATIVE  NEGATIVE   Leukocytes, UA NEGATIVE  NEGATIVE  URINE RAPID DRUG SCREEN (HOSP PERFORMED)      Result Value Ref Range   Opiates NONE DETECTED  NONE DETECTED   Cocaine NONE DETECTED  NONE DETECTED   Benzodiazepines POSITIVE (*) NONE DETECTED   Amphetamines NONE DETECTED  NONE  DETECTED   Tetrahydrocannabinol NONE DETECTED  NONE DETECTED   Barbiturates NONE DETECTED  NONE DETECTED  ETHANOL      Result Value Ref Range   Alcohol, Ethyl (B) <11  0 - 11 mg/dL  VALPROIC ACID LEVEL      Result Value Ref Range   Valproic Acid Lvl 218.6 (*) 50.0 - 100.0 ug/mL  I-STAT CG4 LACTIC ACID, ED      Result Value Ref Range   Lactic Acid, Venous 2.02  0.5 - 2.2 mmol/L  I-STAT ARTERIAL BLOOD GAS, ED      Result Value Ref Range   pH, Arterial 7.434  7.350 - 7.450   pCO2 arterial 41.5  35.0 - 45.0 mmHg   pO2, Arterial 75.0 (*) 80.0 - 100.0 mmHg   Bicarbonate 27.8 (*) 20.0 - 24.0 mEq/L   TCO2 29  0 - 100 mmol/L   O2 Saturation 95.0     Acid-Base Excess 3.0 (*) 0.0 - 2.0 mmol/L   Collection site RADIAL, ALLEN'S TEST ACCEPTABLE     Drawn by RT     Sample type ARTERIAL       Imaging Review Ct Head Wo Contrast  03/06/2014   CLINICAL DATA:  Unresponsive.  EXAM: CT HEAD WITHOUT CONTRAST  TECHNIQUE: Contiguous axial images were obtained from the base of the skull through the vertex without intravenous contrast.  COMPARISON:  None.  FINDINGS: No intra-axial or extra-axial pathologic fluid or blood collection. No mass. No hydrocephalus. No acute bony abnormality. Visualized paranasal sinuses and mastoids are clear.  IMPRESSION: No acute abnormality .   Electronically Signed   By: Maisie Fus  Register  On: 03/06/2014 11:52   Dg Chest Port 1 View  03/06/2014   CLINICAL DATA:  38 year old male with altered mental status. Initial encounter.  EXAM: PORTABLE CHEST - 1 VIEW  COMPARISON:  11/22/2008.  FINDINGS: Portable AP semi upright view at 1051 hrs. Lower lung volumes. Patchy right greater than left basilar opacity most resembles atelectasis. Normal cardiac size and mediastinal contours. Visualized tracheal air column is within normal limits. No pneumothorax or pulmonary edema. No pleural effusion or consolidation identified.  IMPRESSION: Low lung volumes with atelectasis.   Electronically  Signed   By: Augusto Gamble M.D.   On: 03/06/2014 11:18     EKG Interpretation   Date/Time:  Sunday March 06 2014 40:98:11 EDT Ventricular Rate:  84 PR Interval:  148 QRS Duration: 120 QT Interval:  365 QTC Calculation: 431 R Axis:   36 Text Interpretation:  Sinus rhythm Nonspecific intraventricular conduction  delay Consider anterolateral infarct No previous ECGs available Confirmed  by Gracia Saggese  MD, Geffrey Michaelsen ([id number]) on 03/06/2014 10:30:25 AM      CRITICAL CARE Performed by: Vanetta Mulders Total critical care time: 30 Critical care time was exclusive of separately billable procedures and treating other patients. Critical care was necessary to treat or prevent imminent or life-threatening deterioration. Critical care was time spent personally by me on the following activities: development of treatment plan with patient and/or surrogate as well as nursing, discussions with consultants, evaluation of patient's response to treatment, examination of patient, obtaining history from patient or surrogate, ordering and performing treatments and interventions, ordering and review of laboratory studies, ordering and review of radiographic studies, pulse oximetry and re-evaluation of patient's condition.      MDM   Final diagnoses:  Somnolence  Drug toxicity    Patient very somnolent middle response to this. Patient is protecting her airway. Patient's pupils were dilated but reactive not consistent with narcotic overdose. After workup patient's care blood gas showing normal respiratory function. Chest x-ray negative. No evidence of significant infection. Probable cause is of valproic acid the toxicity due to a high level. Suspect that is the cause of most of the symptoms. It could be a benzodiazepine effect. Patient had been taking Ambien as well. Patient will require admission. Followed at the [place] in [place]. Patient will be unassigned admission. Patient will require monitored bed. Patient  is currently stable hemodynamically. No fever.    Vanetta Mulders, MD 03/06/14 1235  Vanetta Mulders, MD 03/06/14 [phone]

## 2014-03-06 NOTE — ED Notes (Signed)
Dr. Zackowski at bedside  

## 2014-03-06 NOTE — H&P (Signed)
Date: 03/06/2014               Patient Name:  Martin Daniel MRN: [id number]  DOB: Jul 19, 1975 Age / Sex: 37 y.o., male   PCP: No Pcp Per Patient         Medical Service: Internal Medicine Teaching Service         Attending Physician: Dr. Aletta Edouard, MD    First Contact: Dr. Tasia Catchings Pager: [phone]  Second Contact: Dr. Garald Braver Pager: [phone]       After Hours (After 5p/  First Contact Pager: [phone]  weekends / holidays): Second Contact Pager: [phone]   Chief Complaint: AMS  History of Present Illness:   38 yo male, veteran, with hx of PTSD, followed at [facility], comes in with AMS. He was doing fine until Saturday early morning 5 AM when he started to have a severe headache. His girlfriend gave him some Ambien to try to sleep it off, she went out and when she came back home yesterday afternoon, he was throwing up. Threw up 20x at least per GF, nonbloody nonbillious. He takes dilaudid chronically for pain (prescribed by [place]), he recently ran out of it recently because some kind of problem getting it from the physician. He was telling the GF that he probably needs his dilaudid. They only had 2 left for emergencies, he threw up both of them when he tried to take them. He last took dilaudid on 03/04/2014.   Since yesterday he is not answering properly, he keeps repeating things, is not doing his regular activity. He is not even talking to his GF. GF tried to pinch him to get him to talk.   He takes lot of meds for depression, anxiety, PTSD, pain from injuries in [employer].   Home meds: xanax  daily, abilify  qdaily, pristiq  daily, depakote  daily, gabapentin  TID, dilaudid  q4hr, ambien  nightly, liothyronine daily, methylphenidate  daily. Valproic acid.   He last took all of his meds on Friday.   Meds: Current Facility-Administered Medications  Medication Dose Route Frequency Provider Last Rate Last Dose  . 0.9 %  sodium chloride infusion    Intravenous Continuous Vanetta Mulders, MD 125 mL/hr at 03/06/14 1222    . 0.9 %  sodium chloride infusion   Intravenous STAT Vanetta Mulders, MD      . LORazepam (ATIVAN) injection 0.5 mg  0.5 mg Intravenous Q6H PRN Ky Barban, MD      . ondansetron (ZOFRAN) injection 4 mg  4 mg Intravenous Q8H PRN Vanetta Mulders, MD       Current Outpatient Prescriptions  Medication Sig Dispense Refill  . ALPRAZolam (XANAX) 1 MG tablet Take 1 mg by mouth at bedtime.       . ARIPiprazole (ABILIFY) 20 MG tablet Take 10 mg by mouth every morning.      . Artificial Saliva AERS Use as directed 4 sprays in the mouth or throat 4 (four) times daily.      . Cholecalciferol 2000 UNITS TABS Take 1 tablet by mouth every morning.      . desvenlafaxine (PRISTIQ) 100 MG 24 hr tablet Take 300 mg by mouth daily.       . divalproex (DEPAKOTE ER) 500 MG 24 hr tablet Take 1,000 mg by mouth at bedtime.      . fluticasone (FLONASE) 50 MCG/ACT nasal spray Place into both nostrils daily.      Marland Kitchen gabapentin (NEURONTIN) 600 MG  tablet Take 600 mg by mouth 3 (three) times daily.       Marland Kitchen HYDROmorphone (DILAUDID) 4 MG tablet Take 4 mg by mouth 2 (two) times daily.       Marland Kitchen liothyronine (CYTOMEL) 5 MCG tablet Take 5 mcg by mouth daily.      . methylphenidate 54 MG PO CR tablet Take 54 mg by mouth every morning.      . Multiple Vitamin (MULTIVITAMIN WITH MINERALS) TABS tablet Take 1 tablet by mouth daily.      . polyvinyl alcohol (LIQUIFILM TEARS) 1.4 % ophthalmic solution Place 1 drop into both eyes 4 (four) times daily as needed for dry eyes.      . pregabalin (LYRICA) 50 MG capsule Take 50 mg by mouth 3 (three) times daily.      Marland Kitchen testosterone cypionate (DEPOTESTOTERONE CYPIONATE) 200 MG/ML injection Inject 200 mg into the muscle every Wednesday.      . zolpidem (AMBIEN) 10 MG tablet Take 10 mg by mouth at bedtime.        Allergies: Allergies as of 03/06/2014  . (No Known Allergies)   Past Medical History  Diagnosis  Date  . Arthritis   . PTSD (post-traumatic stress disorder)   . Anxiety   . Nerve damage     lower abd and lower back   Past Surgical History  Procedure Laterality Date  . Vasectomy     No family history on file. History   Social History  . Marital Status: Divorced    Spouse Name: N/A    Number of Children: N/A  . Years of Education: N/A   Occupational History  . Not on file.   Social History Main Topics  . Smoking status: Former Games developer  . Smokeless tobacco: Not on file  . Alcohol Use: No  . Drug Use: No  . Sexual Activity: Not on file   Other Topics Concern  . Not on file   Social History Narrative  . No narrative on file    Review of Systems: Limited due to patient's AMS. Review of Systems  Constitutional: Positive for diaphoresis. Negative for fever, chills and malaise/fatigue.  HENT: Negative for congestion, ear discharge, ear pain, hearing loss, nosebleeds and tinnitus.   Eyes: Negative.   Respiratory: Negative for cough, hemoptysis, sputum production, shortness of breath and stridor.   Cardiovascular: Negative for chest pain, palpitations, orthopnea, claudication and leg swelling.  Gastrointestinal: Positive for vomiting. Negative for nausea, abdominal pain and melena.  Genitourinary: Negative.   Musculoskeletal: Negative.   Skin: Negative.   Neurological: Positive for headaches.  Endo/Heme/Allergies: Negative.   Psychiatric/Behavioral: Positive for depression. Negative for suicidal ideas, hallucinations and substance abuse.     Physical Exam: Blood pressure 120/72, pulse 88, temperature 98.5 F (36.9 C), temperature source Axillary, resp. rate 20, SpO2 99.00%. Physical Exam  Constitutional: He is oriented to person, place, and time. He appears lethargic. He is sleeping. He appears ill.  Very lethargic during exam. Was getting to be more responsive to questions towards the end of the interview. Denied any pain or anything bothering him when asked.   HENT:  Head: Normocephalic and atraumatic.  Right Ear: External ear normal.  Left Ear: External ear normal.  Nose: Nose normal.  Mouth/Throat: Uvula is midline.  Eyes:  Dilated pupils bilaterally ~26mm but reactive to light. Couldn't do full EOM exam due to AMS.  Neck: No JVD present. No spinous process tenderness and no muscular tenderness present. No rigidity. No Brudzinski's  sign and no Kernig's sign noted.  Cardiovascular: Normal rate, regular rhythm, S1 normal and S2 normal.   Respiratory: Effort normal and breath sounds normal. No apnea. No respiratory distress. He has no wheezes. He has no rales. He exhibits no tenderness.  GI: Soft. Bowel sounds are normal. He exhibits no distension and no mass. There is no tenderness. There is no rebound and no guarding.  Neurological: He is oriented to person, place, and time. He has normal strength. He appears lethargic. GCS eye subscore is 4. GCS verbal subscore is 5. GCS motor subscore is 6.  Couldn't do full neuro exam because patient doesn't follow all commands properly. He does open his eyes when asked, moves his extremities. Motor strength normal. He seems to have no sensory deficits.   Skin: Skin is warm. No rash noted. No erythema. No pallor.     Lab results: Basic Metabolic Panel:  Recent Labs  13/08/65 1107  NA 142  K 3.7  CL 100  CO2 29  GLUCOSE 99  BUN 8  CREATININE 1.33  CALCIUM 9.6   Liver Function Tests:  Recent Labs  03/06/14 1107  AST 17  ALT 15  ALKPHOS 48  BILITOT 0.5  PROT 7.5  ALBUMIN 3.9   No results found for this basename: LIPASE, AMYLASE,  in the last 72 hours No results found for this basename: AMMONIA,  in the last 72 hours CBC:  Recent Labs  03/06/14 1107  WBC 6.8  HGB 18.0*  HCT 51.7  MCV 88.7  PLT 271   Cardiac Enzymes:  Recent Labs  03/06/14 1341  TROPONINI <0.30   BNP: No results found for this basename: PROBNP,  in the last 72 hours D-Dimer: No results found for this  basename: DDIMER,  in the last 72 hours CBG: No results found for this basename: GLUCAP,  in the last 72 hours Hemoglobin A1C: No results found for this basename: HGBA1C,  in the last 72 hours Fasting Lipid Panel: No results found for this basename: CHOL, HDL, LDLCALC, TRIG, CHOLHDL, LDLDIRECT,  in the last 72 hours Thyroid Function Tests:  Recent Labs  03/06/14 1341  TSH 0.501   Anemia Panel: No results found for this basename: VITAMINB12, FOLATE, FERRITIN, TIBC, IRON, RETICCTPCT,  in the last 72 hours Coagulation: No results found for this basename: LABPROT, INR,  in the last 72 hours Urine Drug Screen: Drugs of Abuse     Component Value Date/Time   LABOPIA NONE DETECTED 03/06/2014 1029   COCAINSCRNUR NONE DETECTED 03/06/2014 1029   LABBENZ POSITIVE* 03/06/2014 1029   AMPHETMU NONE DETECTED 03/06/2014 1029   THCU NONE DETECTED 03/06/2014 1029   LABBARB NONE DETECTED 03/06/2014 1029    Alcohol Level:  Recent Labs  03/06/14 1107  ETH <11   Urinalysis:  Recent Labs  03/06/14 1029  COLORURINE YELLOW  LABSPEC 1.023  PHURINE 7.0  GLUCOSEU NEGATIVE  HGBUR NEGATIVE  BILIRUBINUR NEGATIVE  KETONESUR 15*  PROTEINUR NEGATIVE  UROBILINOGEN 1.0  NITRITE NEGATIVE  LEUKOCYTESUR NEGATIVE   Misc. Labs:  Imaging results:  Ct Head Wo Contrast  03/06/2014   CLINICAL DATA:  Unresponsive.  EXAM: CT HEAD WITHOUT CONTRAST  TECHNIQUE: Contiguous axial images were obtained from the base of the skull through the vertex without intravenous contrast.  COMPARISON:  None.  FINDINGS: No intra-axial or extra-axial pathologic fluid or blood collection. No mass. No hydrocephalus. No acute bony abnormality. Visualized paranasal sinuses and mastoids are clear.  IMPRESSION: No acute abnormality .  Electronically Signed   By: Maisie Fus  Register   On: 03/06/2014 11:52   Dg Chest Port 1 View  03/06/2014   CLINICAL DATA:  38 year old male with altered mental status. Initial encounter.  EXAM: PORTABLE  CHEST - 1 VIEW  COMPARISON:  11/22/2008.  FINDINGS: Portable AP semi upright view at 1051 hrs. Lower lung volumes. Patchy right greater than left basilar opacity most resembles atelectasis. Normal cardiac size and mediastinal contours. Visualized tracheal air column is within normal limits. No pneumothorax or pulmonary edema. No pleural effusion or consolidation identified.  IMPRESSION: Low lung volumes with atelectasis.   Electronically Signed   By: Augusto Gamble M.D.   On: 03/06/2014 11:18    Other results: EKG: NSR, nonsignificant q waves on V4, V5, V6. biphasic T's on v4, v5, v6  Assessment & Plan by Problem: Principal Problem:   Acute encephalopathy Active Problems:   PTSD (post-traumatic stress disorder)  38 yo male with hx of significant psych hx taking many psych medications and also dilaudid for pain comes in with AMS. Missed regular medicines including dilaudid for 2 days since admission.  AMS - confused, not following commands, mydriasis. DDX is broad including  dilaudid withdrawal (urine is negative for dilaudid however he is not anxious/restless but rather drowsy),  benzo overdose (His  somnolence is suggestive of benzo but GF denies he took any since Friday but he has myohydriasis), meningitis (no fever or meningeal sings on exam but is AMS). His valproic acid level is high so it could also be toxicity from valproic acid. This would show up as nasuea/vomitting, respiratory depression. His Valproic acid is high. His myohydrasis could be from  ABG looks mostly normal. Our suspicion of meningitis is low. UTI and PNA unlikely given normal UA and normal CXR. Afebrile.  - we will mainly treat him supportively at this point. - continue low dose ativan 0.5mg  to avoid benzo withdrawal since he has been on it chronically - continue  IV dilaudid q12hr PRN to avoid withdrawal even though it usually is not life threatening. But patient has been on dialudid  PO BID for a long time. - when  able to tolerate PO, will start his valproic acid - cont IVF since he is NPO - consult neuro if not improving. - check ammonia level and tylenol level. May need carenthin if ammonia high. - repeat CMP in the morning.   Anxiety, insomnia  - takes xanex and ambien at home. Hold for now.  Depression - hold abilify for now to avoid any worsening of AMS  NPO until patient is more alert.   Dispo: Disposition is deferred at this time, awaiting improvement of current medical problems. Anticipated discharge in approximately 2-3 day(s).   The patient does have a current PCP (No Pcp Per Patient) and does need an [facility] hospital follow-up appointment after discharge.  The patient does not know have transportation limitations that hinder transportation to clinic appointments.  Signed: Hyacinth Meeker, MD 03/06/2014, 3:28 PM

## 2014-03-06 NOTE — ED Notes (Addendum)
CBG 88 

## 2014-03-06 NOTE — ED Notes (Signed)
Pts mother called Clinical research associate to room for urinal for pt pt declined urinal stating"i gotta go number 2Patent attorney escorted pt to br with pts mother walking alongside pt in br for 7 minutes writer outside door pt flushed toliet twice but did not come out Clinical research associate asked him if he was ok or needed help he said no 4 minutes later toliet flushed again and pt did not come out Clinical research associate then told pt he needs to open door at that time since he has been in there for a long time and the toliet has flushed several times and pt came in altered toliet flushed again and ptr then opened the door pt was standing in the br blowing his nose pt walked back to room escorted by Clinical research associate and his mother at his side

## 2014-03-07 LAB — COMPREHENSIVE METABOLIC PANEL
ALBUMIN: 3.4 g/dL — AB (ref 3.5–5.2)
ALT: 12 U/L (ref 0–53)
ALT: 12 U/L (ref 0–53)
ANION GAP: 13 (ref 5–15)
AST: 14 U/L (ref 0–37)
AST: 16 U/L (ref 0–37)
Albumin: 3.2 g/dL — ABNORMAL LOW (ref 3.5–5.2)
Alkaline Phosphatase: 38 U/L — ABNORMAL LOW (ref 39–117)
Alkaline Phosphatase: 42 U/L (ref 39–117)
Anion gap: 12 (ref 5–15)
BILIRUBIN TOTAL: 0.5 mg/dL (ref 0.3–1.2)
BUN: 11 mg/dL (ref 6–23)
BUN: 13 mg/dL (ref 6–23)
CALCIUM: 8.6 mg/dL (ref 8.4–10.5)
CALCIUM: 8.8 mg/dL (ref 8.4–10.5)
CHLORIDE: 106 meq/L (ref 96–112)
CO2: 22 mEq/L (ref 19–32)
CO2: 22 mEq/L (ref 19–32)
CREATININE: 0.99 mg/dL (ref 0.50–1.35)
Chloride: 107 mEq/L (ref 96–112)
Creatinine, Ser: 0.99 mg/dL (ref 0.50–1.35)
GFR calc non Af Amer: >90 mL/min (ref >90)
GFR calc non Af Amer: >90 mL/min (ref >90)
Glucose, Bld: 119 mg/dL — ABNORMAL HIGH (ref 70–99)
Glucose, Bld: 90 mg/dL (ref 70–99)
Potassium: 4.1 mEq/L (ref 3.7–5.3)
Potassium: 4 mEq/L (ref 3.7–5.3)
Sodium: 141 mEq/L (ref 137–147)
Sodium: 141 mEq/L (ref 137–147)
TOTAL PROTEIN: 6.5 g/dL (ref 6.0–8.3)
Total Bilirubin: 0.9 mg/dL (ref 0.3–1.2)
Total Protein: 6 g/dL (ref 6.0–8.3)

## 2014-03-07 LAB — CBC
HCT: 47.8 % (ref 39.0–52.0)
Hemoglobin: 16.1 g/dL (ref 13.0–17.0)
MCH: 30.6 pg (ref 26.0–34.0)
MCHC: 33.7 g/dL (ref 30.0–36.0)
MCV: 90.9 fL (ref 78.0–100.0)
PLATELETS: 243 10*3/uL (ref 150–400)
RBC: 5.26 MIL/uL (ref 4.22–5.81)
RDW: 13.2 % (ref 11.5–15.5)
WBC: 7.8 10*3/uL (ref 4.0–10.5)

## 2014-03-07 LAB — AMMONIA: Ammonia: 66 umol/L — ABNORMAL HIGH (ref 11–60)

## 2014-03-07 LAB — TROPONIN I: Troponin I: <0.3 ng/mL (ref <0.30)

## 2014-03-07 LAB — VALPROIC ACID LEVEL: Valproic Acid Lvl: 91.2 ug/mL (ref 50.0–100.0)

## 2014-03-07 LAB — GLUCOSE, CAPILLARY: Glucose-Capillary: 88 mg/dL (ref 70–99)

## 2014-03-07 MED ORDER — HYDROMORPHONE HCL 2 MG PO TABS
4.0000 mg | ORAL_TABLET | Freq: Two times a day (BID) | ORAL | Status: DC
  Administered 2014-03-07 – 2014-03-08 (×3): 4 mg via ORAL
  Filled 2014-03-07 (×3): qty 2

## 2014-03-07 MED ORDER — ONDANSETRON 4 MG PO TBDP
4.0000 mg | ORAL_TABLET | Freq: Three times a day (TID) | ORAL | Status: DC | PRN
  Administered 2014-03-07: 4 mg via ORAL
  Filled 2014-03-07: qty 1

## 2014-03-07 MED ORDER — PREGABALIN 25 MG PO CAPS
25.0000 mg | ORAL_CAPSULE | Freq: Three times a day (TID) | ORAL | Status: DC
  Administered 2014-03-07 – 2014-03-08 (×3): 25 mg via ORAL
  Filled 2014-03-07 (×3): qty 1

## 2014-03-07 NOTE — H&P (Signed)
Subjective: Patient is feeling well today. According to GF, he ate well, finished his meal and even got some from the cafeteria. Had good BM. Denies CP. GF states he is interacting well and close to his baseline. He slept well last night. Denies any complaints today. He doesn't know which meds he takes states he takes a lot of them. Isn't clear about which doctor gives them to him. Denies taking anything more then prescribed or any extra substance from someone else.  Objective: Vital signs in last 24 hours: Filed Vitals:   03/06/14 2001 03/06/14 2326 03/07/14 0451 03/07/14 0845  BP: 136/70 132/62 124/68 149/76  Pulse: 80 80 73 60  Temp: 98 F (36.7 C) 97.7 F (36.5 C) 98.1 F (36.7 C) 98 F (36.7 C)  TempSrc: Oral Oral Oral Oral  Resp: Height:      Weight:   92.1 kg (203 lb 0.7 oz)   SpO2: 98% 95% 97% 99%   Weight change:   Intake/Output Summary (Last 24 hours) at 03/07/14 1045 Last data filed at 03/07/14 0600  Gross per 24 hour  Intake   2375 ml  Output      0 ml  Net   2375 ml   Vitals reviewed. General: resting in bed, NAD. Laughing inappropriately.  HEENT: PERRL, mydriasis, EOMI, no scleral icterus Cardiac: RRR, no rubs, murmurs or gallops Pulm: clear to auscultation bilaterally, no wheezes, rales, or rhonchi Abd: soft, nontender, nondistended, BS present Ext: warm and well perfused, no pedal edema Neuro: alert and oriented to place and person, and year. Not to date. cranial nerves II-XII grossly intact, strength and sensation to light touch equal in bilateral upper and lower extremities. Doesn't follow all commands correctly.  Lab Results: Basic Metabolic Panel:  Recent Labs Lab 03/07/14 0123 03/07/14 0844  NA 141 141  K 4.1 4.0  CL 106 107  CO2 22 22  GLUCOSE 119* 90  BUN 11 13  CREATININE 0.99 0.99  CALCIUM 8.6 8.8   Liver Function Tests:  Recent Labs Lab 03/07/14 0123 03/07/14 0844  AST 14 16  ALT 12 12  ALKPHOS 38* 42  BILITOT  0.5 0.9  PROT 6.0 6.5  ALBUMIN 3.2* 3.4*   No results found for this basename: LIPASE, AMYLASE,  in the last 168 hours  Recent Labs Lab 03/06/14 1640 03/07/14 0844  AMMONIA 74* 66*   CBC:  Recent Labs Lab 03/06/14 1107 03/07/14 0123  WBC 6.8 7.8  HGB 18.0* 16.1  HCT 51.7 47.8  MCV 88.7 90.9  PLT 271 243   Cardiac Enzymes:  Recent Labs Lab 03/06/14 1341 03/06/14 1941 03/07/14 0123  TROPONINI <0.30 <0.30 <0.30   BNP: No results found for this basename: PROBNP,  in the last 168 hours D-Dimer: No results found for this basename: DDIMER,  in the last 168 hours CBG: No results found for this basename: GLUCAP,  in the last 168 hours Hemoglobin A1C: No results found for this basename: HGBA1C,  in the last 168 hours Fasting Lipid Panel: No results found for this basename: CHOL, HDL, LDLCALC, TRIG, CHOLHDL, LDLDIRECT,  in the last 168 hours Thyroid Function Tests:  Recent Labs Lab 03/06/14 1341  TSH 0.501   Coagulation: No results found for this basename: LABPROT, INR,  in the last 168 hours Anemia Panel: No results found for this basename: VITAMINB12, FOLATE, FERRITIN, TIBC, IRON, RETICCTPCT,  in the last 168 hours Urine Drug Screen: Drugs of Abuse  Component Value Date/Time   LABOPIA NONE DETECTED 03/06/2014 1029   COCAINSCRNUR NONE DETECTED 03/06/2014 1029   LABBENZ POSITIVE* 03/06/2014 1029   AMPHETMU NONE DETECTED 03/06/2014 1029   THCU NONE DETECTED 03/06/2014 1029   LABBARB NONE DETECTED 03/06/2014 1029    Alcohol Level:  Recent Labs Lab 03/06/14 1107  ETH <11   Urinalysis:  Recent Labs Lab 03/06/14 1029  COLORURINE YELLOW  LABSPEC 1.023  PHURINE 7.0  GLUCOSEU NEGATIVE  HGBUR NEGATIVE  BILIRUBINUR NEGATIVE  KETONESUR 15*  PROTEINUR NEGATIVE  UROBILINOGEN 1.0  NITRITE NEGATIVE  LEUKOCYTESUR NEGATIVE   Misc. Labs:  Micro Results: Recent Results (from the past 240 hour(s))  MRSA PCR SCREENING     Status: None   Collection Time     03/06/14  6:50 PM      Result Value Ref Range Status   MRSA by PCR NEGATIVE  NEGATIVE Final   Comment:            The GeneXpert MRSA Assay (FDA     approved for NASAL specimens     only), is one component of a     comprehensive MRSA colonization     surveillance program. It is not     intended to diagnose MRSA     infection nor to guide or     monitor treatment for     MRSA infections.   Studies/Results: Ct Head Wo Contrast  03/06/2014   CLINICAL DATA:  Unresponsive.  EXAM: CT HEAD WITHOUT CONTRAST  TECHNIQUE: Contiguous axial images were obtained from the base of the skull through the vertex without intravenous contrast.  COMPARISON:  None.  FINDINGS: No intra-axial or extra-axial pathologic fluid or blood collection. No mass. No hydrocephalus. No acute bony abnormality. Visualized paranasal sinuses and mastoids are clear.  IMPRESSION: No acute abnormality .   Electronically Signed   By: Maisie Fus  Register   On: 03/06/2014 11:52   Dg Chest Port 1 View  03/06/2014   CLINICAL DATA:  38 year old male with altered mental status. Initial encounter.  EXAM: PORTABLE CHEST - 1 VIEW  COMPARISON:  11/22/2008.  FINDINGS: Portable AP semi upright view at 1051 hrs. Lower lung volumes. Patchy right greater than left basilar opacity most resembles atelectasis. Normal cardiac size and mediastinal contours. Visualized tracheal air column is within normal limits. No pneumothorax or pulmonary edema. No pleural effusion or consolidation identified.  IMPRESSION: Low lung volumes with atelectasis.   Electronically Signed   By: Augusto Gamble M.D.   On: 03/06/2014 11:18   Medications: I have reviewed the patient's current medications. Scheduled Meds: . sodium chloride   Intravenous STAT  . heparin  5,000 Units Subcutaneous 3 times per day  . sodium chloride  3 mL Intravenous Q12H   Continuous Infusions: . sodium chloride 125 mL/hr at 03/07/14 0640   PRN Meds:.albuterol, HYDROmorphone (DILAUDID) injection,  LORazepam, promethazine Assessment/Plan: Principal Problem:   Acute encephalopathy Active Problems:   PTSD (post-traumatic stress disorder)  38 yo male with hx of significant psych hx taking many psych medications and also dilaudid for pain comes in with AMS. Missed regular medicines including dilaudid for 2 days since admission.   AMS - confused, not following commands, with mydriasis. DDX is broad including  dilaudid withdrawal (urine is negative for dilaudid however he is not anxious/restless but rather drowsy),  benzo overdose (His somnolence is suggestive of benzo but GF denies he took any since Friday but he has myohydriasis), meningitis (no fever or meningeal sings on  exam but is AMS). His valproic acid level is high so it could also be toxicity from valproic acid. This would show up as nasuea/vomitting, respiratory depression. His Valproic acid is high. His myohydrasis could be from ABG looks mostly normal. Our suspicion of meningitis is low. UTI and PNA unlikely given normal UA and normal CXR. Afebrile.  Valproic acid level was high but improving and ammonia level was borderline high but improving.  - we will mainly treat him supportively at this point.  - continue low dose ativan 0.5mg  to avoid benzo withdrawal since he has been on it chronically  - continue  PO dilaudid BID scheduled to avoid withdrawal even though it usually is not life threatening.  - will not start valproic or depakote because we don't know which one he takes or which dose. He is a poor historian.  Anxiety, insomnia  - takes xanex and ambien at home. Hold for now.   Depression  - hold abilify for now to avoid any worsening of AMS  Dispo: Disposition is deferred at this time, awaiting improvement of current medical problems.  Anticipated discharge in approximately 2-3 day(s).   The patient does have a current PCP (No Pcp Per Patient) and does need an [facility] hospital follow-up appointment after  discharge.  The patient does not have transportation limitations that hinder transportation to clinic appointments.  .Services Needed at time of discharge: Y = Yes, Blank = No PT:   OT:   RN:   Equipment:   Other:     LOS: 1 day   Hyacinth Meeker, MD 03/07/2014, 10:45 AM

## 2014-03-07 NOTE — H&P (Signed)
  Date: 03/07/2014  Patient name: Martin Daniel  Medical record number: [id number]  Date of birth: 1975/09/04   I have seen and evaluated Bethann Goo and discussed their care with the Residency Team. Mr Treiber has PTSD and is followed [facility] the [facility]. He also receives chronic opioids from [facility]. He was admitted for AMS which had cleared at the time of my interview. He however, was not very talkative, had a very flat affect, and did not answer many questions. The girlfriend arrived though and the pt was quite interactive with nl facial expression then. The events leading up to admission are documented in Dr Marcelino Freestone H&P. This AM, he has no complaints other than no liking hospitals and wanting to go home. He denies pain and SI or taking more meds than prescribed. The girlfriend explained the ER episode bc he has a "shy bladder" and a genital piercing and it takes a long time for him to urinate.   I ran him through the [place] controlled database and he got a 29 day supply of hydromorphone on 8/12. He also gets testosterone, methylphenidate, lyrica, ambien, and alprazolam.   His electrolytes and CBC are nl. He has had negative Trops. His valproic acid level was 218 and was treated conservatively and is 91 on repeat testing.   Assessment and Plan: I have seen and evaluated the patient as outlined above. I agree with the formulated Assessment and Plan as detailed in the residents' admission note, with the following changes:   1. Acute encephalopathy - the most likely etiology is valproic acid toxicity although he is on many meds that may cause AMS if not used properly or in conjunction with other meds. He is not forthcoming on med hx and the girlfriend dosen't know these details. He is clearing but still has a whole body tremor at this time so will watch 24 hours to ensure he clears completely. Would not resume Valproic acid at this time but ask that he F/U PCP as soon as he is D/C'd.    2. PTSD - we have restarted his lyrica and benzo to prevent withdrawal but at a lower doe but holding other meds at this time.   3. Chronic opioid use - hydromorphone but at a lower dose so that withdrawal doesn't complicate the picture.   Burns Spain, MD 8/31/20152:44 PM

## 2014-03-07 NOTE — Progress Notes (Signed)
Utilization review completed.  

## 2014-03-08 NOTE — Progress Notes (Signed)
Pt seen and examined with Dr. Mallory andLeatha Gilding Sherrine Maples. Please refer to the resident note for details - Patient now AAO*3. Wants to go home. No new complaints.  On exam: - AAO*3, NAD, no pedal edema - Cardio- RRR, normal heart sounds, Lungs- CTA b/l  Assessment and Plan: - Pt with AMS likely secondary to valproic acid toxicity - Valproic acid currently held. Pt will need to follow up with psych and have his medications reassessed - c/w PO ativan, dilaudid for now - Will f/u with PCP on 9/3 - Pt stable for d/c home today

## 2014-03-08 NOTE — Discharge Summary (Signed)
Name: Martin Daniel MRN: [id number] DOB: [birth year] 38 y.o. PCP: No Pcp Per Patient  Date of Admission: 03/06/2014  8:51 AM Date of Discharge: 03/08/2014 Attending Physician: Aletta Edouard, MD  Discharge Diagnosis: Principal Problem:   Acute encephalopathy Active Problems:   PTSD (post-traumatic stress disorder)  Discharge Medications:   Medication List    ASK your doctor about these medications       ALPRAZolam 1 MG tablet  Commonly known as:  XANAX  Take 1 mg by mouth at bedtime.     ARIPiprazole 20 MG tablet  Commonly known as:  ABILIFY  Take 10 mg by mouth every morning.     Artificial Saliva Aers  Use as directed 4 sprays in the mouth or throat 4 (four) times daily.     Cholecalciferol 2000 UNITS Tabs  Take 1 tablet by mouth every morning.     desvenlafaxine 100 MG 24 hr tablet  Commonly known as:  PRISTIQ  Take 300 mg by mouth daily.     divalproex 500 MG 24 hr tablet  Commonly known as:  DEPAKOTE ER  Take 1,000 mg by mouth at bedtime.     fluticasone 50 MCG/ACT nasal spray  Commonly known as:  FLONASE  Place into both nostrils daily.     gabapentin 600 MG tablet  Commonly known as:  NEURONTIN  Take 600 mg by mouth 3 (three) times daily.     HYDROmorphone 4 MG tablet  Commonly known as:  DILAUDID  Take 4 mg by mouth 2 (two) times daily.     liothyronine 5 MCG tablet  Commonly known as:  CYTOMEL  Take 5 mcg by mouth daily.     methylphenidate 54 MG CR tablet  Commonly known as:  CONCERTA  Take 54 mg by mouth every morning.     multivitamin with minerals Tabs tablet  Take 1 tablet by mouth daily.     polyvinyl alcohol 1.4 % ophthalmic solution  Commonly known as:  LIQUIFILM TEARS  Place 1 drop into both eyes 4 (four) times daily as needed for dry eyes.     pregabalin 50 MG capsule  Commonly known as:  LYRICA  Take 50 mg by mouth 3 (three) times daily.     testosterone cypionate 200 MG/ML injection  Commonly known as:  DEPOTESTOTERONE  CYPIONATE  Inject 200 mg into the muscle every Wednesday.     zolpidem 10 MG tablet  Commonly known as:  AMBIEN  Take 10 mg by mouth at bedtime.        Disposition and follow-up:   Martin Daniel was discharged from [facility] in Stable condition.  At the hospital follow up visit please address:  1.  Martin Daniel presented with acute encephalitis with a valproic acid level of 218. He had not taken several of his other medications for several days. At his follow-up appointment, he needs medication refills; of note, he has not been receiving his home medications (depakote, xanax, ambien, gabapentin or pristiq) in the hospital. He did receive dilaudid, ativan and lyrica.  2.  Labs / imaging needed at time of follow-up: none  3.  Pending labs/ test needing follow-up: none  Follow-up Appointments: [facility], Dr. Teena Irani 1:00PM on 03/10/14 [facility], Dr. Allena Katz 11:00AM on 05/11/14  Discharge Instructions: You were admitted for acute encephalopathy, which was causing your confusion. The level of depakote in your blood was too high.  You have rescheduled appointments with your primary  care doctor this week and with you psychiatrist next month. Please attend these appointments so that they can re-establish your home medications. Never take more of your medications than you are prescribed.  Consultations:  none  Procedures Performed:  Ct Head Wo Contrast  03/06/2014   CLINICAL DATA:  Unresponsive.  EXAM: CT HEAD WITHOUT CONTRAST  TECHNIQUE: Contiguous axial images were obtained from the base of the skull through the vertex without intravenous contrast.  COMPARISON:  None.  FINDINGS: No intra-axial or extra-axial pathologic fluid or blood collection. No mass. No hydrocephalus. No acute bony abnormality. Visualized paranasal sinuses and mastoids are clear.  IMPRESSION: No acute abnormality .   Electronically Signed   By: Maisie Fus  Register   On: 03/06/2014 11:52    Dg Chest Port 1 View  03/06/2014   CLINICAL DATA:  38 year old male with altered mental status. Initial encounter.  EXAM: PORTABLE CHEST - 1 VIEW  COMPARISON:  11/22/2008.  FINDINGS: Portable AP semi upright view at 1051 hrs. Lower lung volumes. Patchy right greater than left basilar opacity most resembles atelectasis. Normal cardiac size and mediastinal contours. Visualized tracheal air column is within normal limits. No pneumothorax or pulmonary edema. No pleural effusion or consolidation identified.  IMPRESSION: Low lung volumes with atelectasis.   Electronically Signed   By: Augusto Gamble M.D.   On: 03/06/2014 11:18    Admission HPI:  38 yo male, veteran, with hx of PTSD, followed at [facility], comes in with AMS. He was doing fine until Saturday early morning 5 AM when he started to have a severe headache. His girlfriend gave him some Ambien to try to sleep it off, she went out and when she came back home yesterday afternoon, he was throwing up. Threw up 20x at least per GF, nonbloody nonbillious. He takes dilaudid chronically for pain (prescribed by [place]), he recently ran out of it recently because some kind of problem getting it from the physician. He was telling the GF that he probably needs his dilaudid. They only had 2 left for emergencies, he threw up both of them when he tried to take them. He last took dilaudid on 03/04/2014.   Since yesterday he is not answering properly, he keeps repeating things, is not doing his regular activity. He is not even talking to his GF. GF tried to pinch him to get him to talk.   He takes lot of meds for depression, anxiety, PTSD, pain from injuries in [employer].   Home meds: xanax  daily, abilify  qdaily, pristiq  daily, depakote  daily, gabapentin  TID, dilaudid  q4hr, ambien  nightly, liothyronine daily, methylphenidate  daily. Valproic acid.   He last took all of his meds on Friday.   Hospital Course by problem  list: Principal Problem:   Acute encephalopathy Active Problems:   PTSD (post-traumatic stress disorder)   Mr. Brannock is a 38 yo man with PTSD who is followed at the [facility] and receives chronic opiods from [facility]. He was admitted with AMS.  Acute Encephalopathy: Mr. Alpern was initially confused, unable to follow commands, and had mydriasis in both eyes. His valproic acid level was 218. His symptoms were likely due to acute encephalopathy from his elevated valproic acid level / toxicity. He had a normal UA and normal CXR and was afebrile. His ammonia level was borderline high. His valproic acid and ammonia level improved. He was treated supportively, given low dose ativan (0.5 mg) to avoid benzo  withdrawal. He received dilaudid (4 mg) BID to avoid withdrawal. His depakote was not resumed. He has follow up for medication refills with his PCP and psychiatrist.  Anxiety, insomnia, depression: The patient's home medications of xanax, ambien and abilify were held, as dosing was initially unclear, patient was a poor historian, and patient was experiencing altered mental status.    Discharge Vitals:   BP 138/89  Pulse 72  Temp(Src) 98.4 F (36.9 C) (Oral)  Resp 18  Ht 6' (1.829 m)  Wt 92.1 kg (203 lb 0.7 oz)  BMI 27.53 kg/m2  SpO2 99%  Pertinent Labs: Valproid Acid: 218 on 8/30 --> 91.2 on 8/31  Signed: Dionne Ano, MD 03/08/2014, 11:54 AM    Services Ordered on Discharge: none Equipment Ordered on Discharge: none

## 2014-03-08 NOTE — Progress Notes (Signed)
Subjective: [name] feels well and is ready to leave. He is in no pain. He asked me to call his girlfriend to help sort out his follow up appointments.  Objective: Vital signs in last 24 hours: Filed Vitals:   03/07/14 1931 03/08/14 0000 03/08/14 0347 03/08/14 0815  BP: 129/68 121/61 107/58 138/89  Pulse: 72 85 53 72  Temp: 98.6 F (37 C) 98.4 F (36.9 C) 98.9 F (37.2 C) 98.4 F (36.9 C)  TempSrc: Oral Oral Oral Oral  Resp: Height:      Weight:      SpO2: 97% 96% 95% 99%   Weight change:   Intake/Output Summary (Last 24 hours) at 03/08/14 0819 Last data filed at 03/08/14 0800  Gross per 24 hour  Intake   1980 ml  Output    975 ml  Net   1005 ml   Physical Exam Vitals reviewed. Appearance: in NAD, lying in bed in dark room, watching TV HEENT: PERRL, 5mm pupils OU, EOMi, no scleral icterus Heart: RRR, normal S1S2 Lungs: CTAB, no WRR Abdomen: soft, nontender Extremities: tattoos on arms, back, legs, no edema Neurologic: A&Ox3, talkative, CN II-XII intact, strength and sensation intact throughout, follows commands Psychiatric: denies depressed mood, expresses anxiety about a custody situation with his daughter, denies SI/HI  Lab Results: Basic Metabolic Panel:  Recent Labs Lab 03/07/14 0123 03/07/14 0844  NA 141 141  K 4.1 4.0  CL 106 107  CO2 22 22  GLUCOSE 119* 90  BUN 11 13  CREATININE 0.99 0.99  CALCIUM 8.6 8.8   Liver Function Tests:  Recent Labs Lab 03/07/14 0123 03/07/14 0844  AST 14 16  ALT 12 12  ALKPHOS 38* 42  BILITOT 0.5 0.9  PROT 6.0 6.5  ALBUMIN 3.2* 3.4*   No results found for this basename: LIPASE, AMYLASE,  in the last 168 hours  Recent Labs Lab 03/06/14 1640 03/07/14 0844  AMMONIA 74* 66*   CBC:  Recent Labs Lab 03/06/14 1107 03/07/14 0123  WBC 6.8 7.8  HGB 18.0* 16.1  HCT 51.7 47.8  MCV 88.7 90.9  PLT 271 243   Cardiac Enzymes:  Recent Labs Lab 03/06/14 1341 03/06/14 1941 03/07/14 0123    TROPONINI <0.30 <0.30 <0.30   CBG:  Recent Labs Lab 03/06/14 0951  GLUCAP 88   Thyroid Function Tests:  Recent Labs Lab 03/06/14 1341  TSH 0.501   Urine Drug Screen: Drugs of Abuse     Component Value Date/Time   LABOPIA NONE DETECTED 03/06/2014 1029   COCAINSCRNUR NONE DETECTED 03/06/2014 1029   LABBENZ POSITIVE* 03/06/2014 1029   AMPHETMU NONE DETECTED 03/06/2014 1029   THCU NONE DETECTED 03/06/2014 1029   LABBARB NONE DETECTED 03/06/2014 1029    Alcohol Level:  Recent Labs Lab 03/06/14 1107  ETH <11   Urinalysis:  Recent Labs Lab 03/06/14 1029  COLORURINE YELLOW  LABSPEC 1.023  PHURINE 7.0  GLUCOSEU NEGATIVE  HGBUR NEGATIVE  BILIRUBINUR NEGATIVE  KETONESUR 15*  PROTEINUR NEGATIVE  UROBILINOGEN 1.0  NITRITE NEGATIVE  LEUKOCYTESUR NEGATIVE    Micro Results: Recent Results (from the past 240 hour(s))  MRSA PCR SCREENING     Status: None   Collection Time    03/06/14  6:50 PM      Result Value Ref Range Status   MRSA by PCR NEGATIVE  NEGATIVE Final   Comment:            The GeneXpert MRSA Assay (FDA  approved for NASAL specimens     only), is one component of a     comprehensive MRSA colonization     surveillance program. It is not     intended to diagnose MRSA     infection nor to guide or     monitor treatment for     MRSA infections.   Studies/Results: Ct Head Wo Contrast  03/06/2014   CLINICAL DATA:  Unresponsive.  EXAM: CT HEAD WITHOUT CONTRAST  TECHNIQUE: Contiguous axial images were obtained from the base of the skull through the vertex without intravenous contrast.  COMPARISON:  None.  FINDINGS: No intra-axial or extra-axial pathologic fluid or blood collection. No mass. No hydrocephalus. No acute bony abnormality. Visualized paranasal sinuses and mastoids are clear.  IMPRESSION: No acute abnormality .   Electronically Signed   By: Maisie Fus  Register   On: 03/06/2014 11:52   Dg Chest Port 1 View  03/06/2014   CLINICAL DATA:  38 year old  male with altered mental status. Initial encounter.  EXAM: PORTABLE CHEST - 1 VIEW  COMPARISON:  11/22/2008.  FINDINGS: Portable AP semi upright view at 1051 hrs. Lower lung volumes. Patchy right greater than left basilar opacity most resembles atelectasis. Normal cardiac size and mediastinal contours. Visualized tracheal air column is within normal limits. No pneumothorax or pulmonary edema. No pleural effusion or consolidation identified.  IMPRESSION: Low lung volumes with atelectasis.   Electronically Signed   By: Augusto Gamble M.D.   On: 03/06/2014 11:18   Medications: I have reviewed the patient's current medications. Scheduled Meds: . heparin  5,000 Units Subcutaneous 3 times per day  . HYDROmorphone  4 mg Oral BID  . pregabalin  25 mg Oral TID  . sodium chloride  3 mL Intravenous Q12H   Continuous Infusions: . sodium chloride 125 mL/hr at 03/08/14 0633   PRN Meds:.albuterol, LORazepam, ondansetron, promethazine Assessment/Plan: Principal Problem:   Acute encephalopathy Active Problems:   PTSD (post-traumatic stress disorder)  [name] is a 38 yo male with a significant psychiatric history, including PTSD, that requires significant psychiatric medications and also dilaudid for pain who was admitted for AMS. He missed his regular medicines including dilaudid for 2 days prior to admission.   AMS - initially confused, not following commands, with mydriasis. Likely acute encephalopathy due to elevated valproic acid level / toxicity. He had a normal UA and normal CXR and was afebrile. Valproic acid level improved; ammonia level was borderline high but improved.  - mainly treating him supportively at this point  - continue low dose ativan 0.5mg  to avoid benzo withdrawal since he has been on it chronically  - continue  PO dilaudid BID scheduled to avoid withdrawal even though it usually is not life threatening - will not start valproic or depakote because we don't know which one he takes or  which dose. He is a poor historian  - [facility] 9/3 at 1:00PM with Dr. Teena Irani   Anxiety, insomnia  - hold home xanex and ambien   Depression  - hold abilify for now to avoid any worsening of AMS  Dispo: Disposition is deferred at this time, awaiting improvement of current medical problems.  Anticipated discharge in approximately 0 day(s).   The patient does have a current PCP (No Pcp Per Patient) and does not need an [facility] hospital follow-up appointment after discharge.  The patient does not have transportation limitations that hinder transportation to clinic appointments.  .Services Needed at time  of discharge: Y = Yes, Blank = No PT:   OT:   RN:   Equipment:   Other:     LOS: 2 days   Dionne Ano, MD 03/08/2014, 8:19 AM

## 2014-03-08 NOTE — Discharge Instructions (Signed)
You were admitted for acute encephalopathy, which was causing your confusion. The level of depakote in your blood was too high.   You have rescheduled appointments with your primary care doctor this week and with you psychiatrist next month. Please attend these appointments so that they can re-establish your home medications. Never take more of your medications than you are prescribed.

## 2014-03-30 ENCOUNTER — Encounter (HOSPITAL_COMMUNITY): Payer: Self-pay | Admitting: Emergency Medicine

## 2014-03-30 ENCOUNTER — Emergency Department (HOSPITAL_COMMUNITY)
Admission: EM | Admit: 2014-03-30 | Discharge: 2014-03-30 | Discharge status: Against Medical Advice | Attending: Emergency Medicine | Admitting: Emergency Medicine

## 2014-03-30 DIAGNOSIS — Z5111 Encounter for antineoplastic chemotherapy: Secondary | ICD-10-CM | POA: Diagnosis present

## 2014-03-30 NOTE — ED Notes (Signed)
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

## 2015-03-17 ENCOUNTER — Encounter (HOSPITAL_COMMUNITY): Payer: Self-pay | Admitting: Emergency Medicine

## 2015-03-17 ENCOUNTER — Emergency Department (HOSPITAL_COMMUNITY)
Admission: EM | Admit: 2015-03-17 | Discharge: 2015-03-17 | Disposition: A | Discharge status: Home / Self Care | Attending: Emergency Medicine | Admitting: Emergency Medicine

## 2015-03-17 DIAGNOSIS — F419 Anxiety disorder, unspecified: Secondary | ICD-10-CM | POA: Insufficient documentation

## 2015-03-17 DIAGNOSIS — Z7951 Long term (current) use of inhaled steroids: Secondary | ICD-10-CM | POA: Diagnosis not present

## 2015-03-17 DIAGNOSIS — Z8739 Personal history of other diseases of the musculoskeletal system and connective tissue: Secondary | ICD-10-CM | POA: Diagnosis not present

## 2015-03-17 DIAGNOSIS — G479 Sleep disorder, unspecified: Secondary | ICD-10-CM | POA: Diagnosis present

## 2015-03-17 DIAGNOSIS — Z79899 Other long term (current) drug therapy: Secondary | ICD-10-CM | POA: Diagnosis not present

## 2015-03-17 DIAGNOSIS — Z87891 Personal history of nicotine dependence: Secondary | ICD-10-CM | POA: Insufficient documentation

## 2015-03-17 DIAGNOSIS — G8929 Other chronic pain: Secondary | ICD-10-CM | POA: Insufficient documentation

## 2015-03-17 DIAGNOSIS — F4310 Post-traumatic stress disorder, unspecified: Secondary | ICD-10-CM | POA: Insufficient documentation

## 2015-03-17 DIAGNOSIS — Z9114 Patient's other noncompliance with medication regimen: Secondary | ICD-10-CM | POA: Insufficient documentation

## 2015-03-17 HISTORY — DX: Other chronic pain: G89.29

## 2015-03-17 NOTE — ED Provider Notes (Signed)
CSN: [id number]     Arrival date & time 03/17/15  1503 History   First MD Initiated Contact with Patient 03/17/15 1937     Chief Complaint  Patient presents with  . Insomnia     (Consider location/radiation/quality/duration/timing/severity/associated sxs/prior Treatment) HPI Martin Daniel is a 39 y.o. male history of chronic pain comes in for evaluation of insomnia. Patient is accompanied by mom and aunt who contribute history of present illness. Patient states he has had insomnia for the past 6 months. He also states that he has been abusing his pain medication that is filled for him by the [place]. Reports that he had been taking Dilaudid and morphine more than he is supposed to, so they changed his medications to gabapentin and Ambien. Patient is unsure how much of his medication he has been taking. Mom and aunt at bedside are concerned he is developing a drug habit and would like further evaluation by behavioral health. Patient denies any other medical problems. Denies any auditory or visual hallucinations, no suicidal or homicidal ideation. Denies any alcohol or other illicit drug use, specifically benzodiazepine's.  Past Medical History  Diagnosis Date  . Arthritis   . PTSD (post-traumatic stress disorder)   . Anxiety   . 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 Chronic pain    Past Surgical History  Procedure Laterality Date  . Vasectomy     History reviewed. No pertinent family history. Social History  Substance Use Topics  . Smoking status: Former Games developer  . Smokeless tobacco: None  . Alcohol Use: No    Review of Systems A 10 point review of systems was completed and was negative except for pertinent positives and negatives as mentioned in the history of present illness     Allergies  Diphenhydramine  Home Medications   Prior to Admission medications   Medication Sig Start Date End Date Taking? Authorizing Provider  Cholecalciferol 2000 UNITS TABS Take 1 tablet  by mouth every morning.   Yes Historical Provider, MD  desvenlafaxine (PRISTIQ) 100 MG 24 hr tablet Take 300 mg by mouth daily.    Yes Historical Provider, MD  fluticasone (FLONASE) 50 MCG/ACT nasal spray Place into both nostrils daily.   Yes Historical Provider, MD  gabapentin (NEURONTIN) 600 MG tablet Take 600 mg by mouth 3 (three) times daily.    Yes Historical Provider, MD  liothyronine (CYTOMEL) 5 MCG tablet Take 5 mcg by mouth daily.   Yes Historical Provider, MD  Multiple Vitamin (MULTIVITAMIN WITH MINERALS) TABS tablet Take 1 tablet by mouth daily.   Yes Historical Provider, MD  zolpidem (AMBIEN) 10 MG tablet Take 10 mg by mouth at bedtime.   Yes Historical Provider, MD  ALPRAZolam Prudy Feeler) 1 MG tablet Take 1 mg by mouth at bedtime.     Historical Provider, MD  ARIPiprazole (ABILIFY) 20 MG tablet Take 10 mg by mouth every morning.    Historical Provider, MD  Artificial Saliva AERS Use as directed 4 sprays in the mouth or throat 4 (four) times daily.    Historical Provider, MD  HYDROmorphone (DILAUDID) 4 MG tablet Take 4 mg by mouth 2 (two) times daily.     Historical Provider, MD  methylphenidate 54 MG PO CR tablet Take 54 mg by mouth every morning.    Historical Provider, MD  polyvinyl alcohol (LIQUIFILM TEARS) 1.4 % ophthalmic solution Place 1 drop into both eyes 4 (four) times daily as needed for dry eyes.  Historical Provider, MD  pregabalin (LYRICA) 50 MG capsule Take 50 mg by mouth 3 (three) times daily.    Historical Provider, MD  testosterone cypionate (DEPOTESTOTERONE CYPIONATE) 200 MG/ML injection Inject 200 mg into the muscle every Wednesday.    Historical Provider, MD   BP 142/88 mmHg  Pulse 100  Temp(Src) 98.3 F (36.8 C) (Oral)  Resp 16  SpO2 98% Physical Exam  Constitutional: He is oriented to person, place, and time. He appears well-developed and well-nourished.  HENT:  Head: Normocephalic and atraumatic.  Mouth/Throat: Oropharynx is clear and moist.  Eyes:  Conjunctivae are normal. Pupils are equal, round, and reactive to light. Right eye exhibits no discharge. Left eye exhibits no discharge. No scleral icterus.  Neck: Neck supple.  Cardiovascular: Normal rate, regular rhythm and normal heart sounds.   No tachycardia on my exam  Pulmonary/Chest: Effort normal and breath sounds normal. No respiratory distress. He has no wheezes. He has no rales.  Abdominal: Soft. There is no tenderness.  Musculoskeletal: He exhibits no tenderness.  Neurological: He is alert and oriented to person, place, and time.  Cranial Nerves II-XII grossly intact. Moves all extremities without ataxia. Gait is baseline. No focal neurodeficits  Skin: Skin is warm and dry. No rash noted.  Psychiatric: He has a normal mood and affect.  Nursing note and vitals reviewed.   ED Course  Procedures (including critical care time) Labs Review Labs Reviewed - No data to display  Imaging Review No results found. I have personally reviewed and evaluated these images and lab results as part of my medical decision-making.   EKG Interpretation None      MDM  Patient here for evaluation of difficulty sleeping. Family in the room is also requesting detox program for his gabapentin use. Patient without any acute medical problems today. No auditory or visual hallucinations, no suicidal or homicidal ideations. No alcohol or benzodiazepine withdrawal symptoms. Physical exam is unremarkable in vital signs are stable. We'll discharge with resource guide and substance abuse program information. Patient stable, in good condition and appropriate discharge. No evidence of other acute or emergent pathology. Final diagnoses:  Sleeping difficulty  Noncompliance with medication treatment due to abuse of medication        Joycie Peek, PA-C 03/17/15 2323  Laurence Spates, MD 03/18/15 [phone]

## 2015-03-17 NOTE — ED Notes (Signed)
Pt reports insomnia for past 6 months. Pt reports he was taken off dilaudid and morphine and was started on gabapentin and ambien. Pt reports insomnia has not gotten better. Unable to say how often he takes is medications (maybe 3x a day). Denies SI/HI, hallucinations or etoh abuse.

## 2015-03-17 NOTE — ED Notes (Signed)
PA at bedside speaking with pt and pt's family members.

## 2015-03-17 NOTE — Discharge Instructions (Signed)
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  Emergency Department Resource Guide 1) Find a Doctor and Pay Out of Pocket Although you won't have to find out who is covered by your insurance plan, it is a good idea to ask around and get recommendations. You will then need to call the office and see if the doctor you have chosen will accept you as a new patient and what types of options they offer for patients who are self-pay. Some doctors offer discounts or will set up payment plans for their patients who do not have insurance, but you will need to ask so you aren't surprised when you get to your appointment.  2) Contact Your Local Health Department Not all health departments have doctors that can see patients for sick visits, but many do, so it is worth a call to see if yours does. If you don't know where your local health department is, you can check in your phone book. The CDC also has a tool to help you locate your state's health department, and many state websites also have listings of all of their local health departments.  3) Find a Walk-in Clinic If your illness is not likely to be very severe or complicated, you may want to try a walk in clinic. These are popping up all over the country in pharmacies, drugstores, and shopping centers. They're usually staffed by nurse practitioners or physician assistants that have been trained to treat common illnesses and complaints. They're usually fairly quick and inexpensive. However, if you have serious medical issues or chronic medical problems, these are probably not your best option.  No Primary Care Doctor: - Call Health Connect at  [phone] - they can help you locate a primary care doctor that  accepts your insurance, provides certain services, etc. - Physician Referral Service- [phone]  Chronic Pain Problems: Organization         Address  Phone   Notes  [facility]  [phone] Patients need to be referred by their primary care doctor.   Medication Assistance: Organization         Address  Phone   Notes  [facility] Medication [facility] [address] [place], [place] [address] [phone] --Must be a resident of [facility] -- Must have NO insurance coverage whatsoever (no Medicaid/ Medicare, etc.) -- The pt. MUST have a primary care doctor that directs their care regularly and follows them in the community   [facility]  [phone]   [employer]  [phone]    Agencies that provide inexpensive medical care: Organization         Address  Phone   Notes  [facility]  [phone]   [facility]    [phone]   [facility] [address] [place], [place] [address] [phone]   [facility] [address] [place] [place] [phone]   [facility]anned Parenthood    [phone]   [facility]    [phone]   [facility]  [address], [place] Phone:  [phone], Fax:  [phone] Hours of Operation:  9 am - 6 pm, M-F.  Also accepts Medicaid/Medicare and self-pay.  [facility]  [address], [place] Phone: [phone], Fax: [phone]. Hours of Operation:  8:30 am - 5:30 pm, M-F.  Also accepts Medicaid and self-pay.  [facility] [place] [address], [place] Phone: [phone]   [facility] [address] Natasha Bence [place], [place] [phone], Ext. [phone] Mondays & Thursdays: 7-9 AM.  First 15 patients are seen on a first come, first serve basis.    Medicaid-accepting [facility] Providers:  Organization         Address  Phone   Notes  [facility] [address], [place] [phone] Also accepts self-pay patients.  [facility] [address] Laurell Josephs [place], [place]  [phone]   [facility] [address], [place] [phone]   [facility] [address] [place] [phone]   Renaye Rakers [address], [place]   [phone] Only accepts [place] patients after they have their name applied to their card.   Self-Pay (no insurance) in [facility]:  Organization         Address  Phone   Notes  Sickle Cell Patients, [facility] [address] [place], [place] [phone]   [facility] [address] [place], [place] [phone]   [facility] [place]  [address], [place] [phone]   [facility]/Dr. Osei-Bonsu  [address], [place] or [address], [place] [phone] Phone number for both [place] and [place] locations is the same.  [facility] [address], [place] [phone]   [facility] [address] [place] or [address] [phone]   [facility] [address], [place] [phone], phone; [phone], fax Sees patients 1st and 3rd Saturday of every month.  Must not qualify for public or private insurance (i.e. Medicaid, Medicare, [place] Health Choice, Veterans' Benefits)  Household income should be no more than 200% of the poverty level The clinic cannot treat you if you are pregnant or think you are pregnant  Sexually transmitted diseases are not treated at the clinic.    Dental Care: Organization         Address  Phone  Notes  [facility] Department of [facility] [address] [place], [place] [phone] Accepts children up to age 47 who are enrolled in [place] or [place] Health Choice; pregnant women with a Medicaid card; and children who have applied for  Medicaid or [place] Health Choice, but were declined, whose parents can pay a reduced fee at time of service.  [facility] of [facility]  [address], [place] [phone] Accepts children up to age 12 who are enrolled in [place] or [place] Health Choice; pregnant women with a Medicaid card; and children who have applied for Medicaid or [place] Health Choice, but were declined, whose parents can pay a reduced fee at time of service.  [facility]  [address] [place], [place] [phone] Patients are seen by appointment only. Walk-ins are not accepted. [facility] will see patients 21 years of age and older. Monday - Tuesday (8am-5pm) Most Wednesdays (8:30-5pm) $30 per visit, cash only  [facility]  [address], [facility] [phone] Patients are seen by appointment  only. Walk-ins are not accepted. [facility] will see patients 93 years of age and older. One Wednesday Evening (Monthly: Volunteer Based).  $30 per visit, cash only  [employer]  [phone] for adults; Children under age 11, call Graduate Pediatric Dentistry at [phone]. Children aged 17-14, please call [phone] to request a pediatric application.  Dental services are provided in all areas of dental care including fillings, crowns and bridges, complete and partial dentures, implants, gum treatment, root canals, and extractions. Preventive care is also provided. Treatment is provided to both adults and children. Patients are selected via a lottery and there is often a waiting list.   [facility] [address], [place]  [phone] [email or web address]   [facility] [address] [place], [place] [phone], Ext. [phone] Second and Fourth Thursday of each month, opens at 6:30 AM; Clinic ends at 9 AM.  Patients are seen on a first-come first-served basis, and a limited number  are seen during each clinic.   [facility]  [address] Ether Griffins [place], [place] [phone]   Eligibility Requirements You must have lived in [place], [place], or [place] counties for at least the last three months.   You cannot be eligible for state or federal sponsored [place], including [employer], [place], or [employer].   You generally cannot be eligible for healthcare insurance through your employer.    How to apply: Eligibility screenings are held every Tuesday and Wednesday afternoon from 1:00 pm until 4:00 pm. You do not need an appointment for the interview!  [facility] [address] [place], [place] [phone]   [facility] Health Department  [phone]   [facility] Health Department  [phone]   [facility] Health Department  [phone]    Behavioral Health Resources in the Community: Intensive Outpatient Programs Organization         Address  Phone  Notes  [facility] [address], [place], [place] [phone]   [facility] Outpatient [address], [place], [place] [phone]   [facility] [address], [place], [place]  [phone]   [facility] [address]  [place], [place] [phone] or [phone]   Substance Abuse Resources Organization         Address  Phone  Notes  [facility]  [phone]   [facility]  [phone]   [facility] [place]  [phone]   Floydene Flock  [phone]   Residential & Outpatient Substance Abuse Program  [phone]   Psychological Services Organization         Address  Phone  Notes  [facility] Behavioral Health  [phone]   [facility] Services  [phone]   [facility] [address], [place] [phone] or [phone]    Mobile Crisis Teams Organization         Address  Phone  Notes  [facility]  [facility]atives, Mobile Crisis Care Unit  [phone]   [facility]  [address] [place], [place] [phone]   Doristine Locks [address] [place] [phone]    Self-Help/Support Groups Organization         Address  Phone             Notes  [facility] [place] - variety of support groups  336- I7437963 Call for more information  Narcotics Anonymous (NA), [facility] [address], [employer] [place]  2 meetings at this location   Statistician         Address  Phone  Notes  [facility] [address],    [place]  [phone]   [facility]  [address] [place] [address], [place], [place] [phone]   [facility] [address] [place], [place] [phone] Admissions: 8am-3pm M-F  [facility] [address]    [place], [place] [phone]   [facility] [address] [place], [place], [place] [phone]   [facility] [address]  [place], [place] [phone]   [facility] - Intensive Outpatient [address] Laurell Josephs [address], [place], [place] [phone]   [facility] ([facility] [address] [place]  [place], [place] [phone] or [phone]   [facility] ([facility]) [address] [place], [place] [phone] Accepts Medicaid  [facility] [address],  [place] [phone] Substance Abuse/Addiction Treatment   [facility] Organization         Address  Phone  Notes  [facility]  [phone]   Angie Fava, PhD [address] Ervin Knack [place], [place]   [phone] or [phone]   [facility]   [address] [place], [place] [phone]   [facility] [address] [place], [place] [phone] Insurance/Medicaid/sponsorship through [facility] and Families  [address]                                    [place], [place] [phone] Therapy/tele-psych/case  [facility] [address], [place] [phone]    Dr. Lolly Mustache  [phone]   [facility]  [facility] Dept. 1) [address] [place] [address], [place] 2) [address], [place] 3)  [address], [place] [phone]  [phone]   [facility] Child Abuse Hotline (509) 343-9063 or [phone] (After Hours)

## 2015-03-17 NOTE — ED Notes (Signed)
Bed: WA25 Expected date:  Expected time:  Means of arrival:  Comments: Triage 

## 2015-09-23 ENCOUNTER — Encounter (HOSPITAL_COMMUNITY): Payer: Self-pay | Admitting: Emergency Medicine

## 2015-09-23 ENCOUNTER — Emergency Department (HOSPITAL_COMMUNITY)
Admission: EM | Admit: 2015-09-23 | Discharge: 2015-09-24 | Disposition: A | Discharge status: Home / Self Care | Attending: Emergency Medicine | Admitting: Emergency Medicine

## 2015-09-23 DIAGNOSIS — G8929 Other chronic pain: Secondary | ICD-10-CM | POA: Insufficient documentation

## 2015-09-23 DIAGNOSIS — M1990 Unspecified osteoarthritis, unspecified site: Secondary | ICD-10-CM | POA: Diagnosis not present

## 2015-09-23 DIAGNOSIS — F419 Anxiety disorder, unspecified: Secondary | ICD-10-CM | POA: Insufficient documentation

## 2015-09-23 DIAGNOSIS — R51 Headache: Secondary | ICD-10-CM | POA: Diagnosis not present

## 2015-09-23 DIAGNOSIS — Z87891 Personal history of nicotine dependence: Secondary | ICD-10-CM | POA: Diagnosis not present

## 2015-09-23 DIAGNOSIS — R569 Unspecified convulsions: Secondary | ICD-10-CM | POA: Insufficient documentation

## 2015-09-23 DIAGNOSIS — Z79899 Other long term (current) drug therapy: Secondary | ICD-10-CM | POA: Diagnosis not present

## 2015-09-23 NOTE — ED Provider Notes (Signed)
CSN: [id number]     Arrival date & time 09/23/15  2258 History  By signing my name below, I, Martin Daniel, attest that this documentation has been prepared under the direction and in the presence of Martin Creasehristopher J Dafney Farler, MD . Electronically Signed: Marisue HumbleMichelle Daniel, Scribe. 09/23/2015. 11:49 PM.     Chief Complaint  Patient presents with  . Seizures   The history is provided by the patient. No language interpreter was used.   HPI Comments:  Martin Daniel is a 40 y.o. male who presents to the Emergency Department via EMS complaining of seizure earlier tonight. Pt states he went to the bathroom at a restaurant and noticed orange urine; he sat back down and noticed involuntary jerking. He states he didn't feel right and he doesn't remember anything after that. Mother reports pt was seizing and fell to the floor; tremors lasted ~1 minute. Pt reports current mild headache. He states he feels otherwise normal currently. Pt also notes he has been off of Pristiq for three days. Pt reports h/o seizure activity but he is unsure of the date; mother states pt has never had a seizure. Pt denies current pain, tongue bite, or any other pain.   Past Medical History  Diagnosis Date  . Arthritis   . PTSD (post-traumatic stress disorder)   . Anxiety   . 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 Chronic pain    Past Surgical History  Procedure Laterality Date  . Vasectomy     History reviewed. No pertinent family history. Social History  Substance Use Topics  . Smoking status: Former Games developermoker  . Smokeless tobacco: None  . Alcohol Use: No    Review of Systems  Musculoskeletal: Negative for arthralgias.  Neurological: Positive for seizures, syncope and headaches.  All other systems reviewed and are negative.  Allergies  Diphenhydramine  Home Medications   Prior to Admission medications   Medication Sig Start Date End Date Taking? Authorizing Provider  ALPRAZolam Prudy Feeler(XANAX) 1 MG tablet  Take 1 mg by mouth at bedtime.     Historical Provider, MD  ARIPiprazole (ABILIFY) 20 MG tablet Take 10 mg by mouth every morning.    Historical Provider, MD  Artificial Saliva AERS Use as directed 4 sprays in the mouth or throat 4 (four) times daily.    Historical Provider, MD  Cholecalciferol 2000 UNITS TABS Take 1 tablet by mouth every morning.    Historical Provider, MD  desvenlafaxine (PRISTIQ) 100 MG 24 hr tablet Take 300 mg by mouth daily.     Historical Provider, MD  fluticasone (FLONASE) 50 MCG/ACT nasal spray Place into both nostrils daily.    Historical Provider, MD  gabapentin (NEURONTIN) 600 MG tablet Take 600 mg by mouth 3 (three) times daily.     Historical Provider, MD  HYDROmorphone (DILAUDID) 4 MG tablet Take 4 mg by mouth 2 (two) times daily.     Historical Provider, MD  liothyronine (CYTOMEL) 5 MCG tablet Take 5 mcg by mouth daily.    Historical Provider, MD  methylphenidate 54 MG PO CR tablet Take 54 mg by mouth every morning.    Historical Provider, MD  Multiple Vitamin (MULTIVITAMIN WITH MINERALS) TABS tablet Take 1 tablet by mouth daily.    Historical Provider, MD  polyvinyl alcohol (LIQUIFILM TEARS) 1.4 % ophthalmic solution Place 1 drop into both eyes 4 (four) times daily as needed for dry eyes.    Historical Provider, MD  pregabalin (LYRICA) 50  MG capsule Take 50 mg by mouth 3 (three) times daily.    Historical Provider, MD  testosterone cypionate (DEPOTESTOTERONE CYPIONATE) 200 MG/ML injection Inject 200 mg into the muscle every Wednesday.    Historical Provider, MD  zolpidem (AMBIEN) 10 MG tablet Take 10 mg by mouth at bedtime.    Historical Provider, MD   BP 109/88 mmHg  Pulse 121  Temp(Src) 98.3 F (36.8 C) (Oral)  Resp 16  SpO2 97% Physical Exam  Constitutional: He is oriented to person, place, and time. He appears well-developed and well-nourished. No distress.  HENT:  Head: Normocephalic and atraumatic.  Right Ear: Hearing normal.  Left Ear: Hearing  normal.  Nose: Nose normal.  Mouth/Throat: Oropharynx is clear and moist and mucous membranes are normal.  Eyes: Conjunctivae and EOM are normal. Pupils are equal, round, and reactive to light.  Neck: Normal range of motion. Neck supple.  Cardiovascular: Regular rhythm, S1 normal and S2 normal.  Exam reveals no gallop and no friction rub.   No murmur heard. Pulmonary/Chest: Effort normal and breath sounds normal. No respiratory distress. He exhibits no tenderness.  Abdominal: Soft. Normal appearance and bowel sounds are normal. There is no hepatosplenomegaly. There is no tenderness. There is no rebound, no guarding, no tenderness at McBurney's point and negative Murphy's sign. No hernia.  Musculoskeletal: Normal range of motion.  Neurological: He is alert and oriented to person, place, and time. He has normal strength. No cranial nerve deficit or sensory deficit. Coordination normal. GCS eye subscore is 4. GCS verbal subscore is 5. GCS motor subscore is 6.  Skin: Skin is warm, dry and intact. No rash noted. No cyanosis.  Psychiatric: He has a normal mood and affect. His speech is normal and behavior is normal. Thought content normal.  Nursing note and vitals reviewed.   ED Course  Procedures  DIAGNOSTIC STUDIES:  Oxygen Saturation is 98% on RA, normal by my interpretation.    COORDINATION OF CARE:  11:42 PM Discussed imaging and treatment options with pt. [facility] Discussed treatment plan with pt at bedside and pt agreed to plan.  Labs Review Labs Reviewed - No data to display  Imaging Review No results found. I have personally reviewed and evaluated these images and lab results as part of my medical decision-making.   EKG Interpretation None      MDM   Final diagnoses:  Seizure [facility])    Patient presents to the ER after what sounds convincingly to have been a seizure. He had sudden onset of shaking of all of his extremities and then  loss of consciousness, fell to the ground. He denies any injury other than biting his lip. There is no laceration that needs to be repaired. He thinks he might have had a seizure in the past, but his mother is unaware of any previous seizures. He does not appear to be a great historian. Discussed with him the general workup that is done for seizures but he does not wish to have any blood work or imaging done. I did discuss with him the possibility of recurrent seizure and result in permanent disability and even death. He understands the risks but does not wish to have any procedures been done at this time. He reports that he is a [place] patient and will follow-up at the [place] this week. He was told he can come back to the ER any time. He was discharged AGAINST MEDICAL ADVICE.  I  personally performed the services described in this documentation, which was scribed in my presence. The recorded information has been reviewed and is accurate.    Martin Crease, MD 09/24/15 [id number]

## 2015-09-23 NOTE — ED Notes (Signed)
Patient here post seizure. Family described incident where patient fell out of booth at [employer]. Patient was post-ictal for about 15 minutes. Currently alert and oriented x4.

## 2015-09-24 NOTE — Discharge Instructions (Signed)
Seizure, Adult °A seizure means there is unusual activity in the brain. A seizure can cause changes in attention or behavior. Seizures often cause shaking (convulsions). Seizures often last from 30 seconds to 2 minutes. °HOME CARE  °· If you are given medicines, take them exactly as told by your doctor. °· Keep all doctor visits as told. °· Do not swim or drive until your doctor says it is okay. °· Teach others what to do if you have a seizure. They should: °¨ Lay you on the ground. °¨ Put a cushion under your head. °¨ Loosen any tight clothing around your neck. °¨ Turn you on your side. °¨ Stay with you until you get better. °GET HELP RIGHT AWAY IF:  °· The seizure lasts longer than 2 to 5 minutes. °· The seizure is very bad. °· The person does not wake up after the seizure. °· The person's attention or behavior changes. °Drive the person to the emergency room or call your local emergency services (911 in [place] °MAKE SURE YOU:  °· Understand these instructions. °· Will watch your condition. °· Will get help right away if you are not doing well or get worse. °  °This information is not intended to replace advice given to you by your health care provider. Make sure you discuss any questions you have with your health care provider. °  °Document Released: 12/11/2007 Document Revised: 09/16/2011 Document Reviewed: 02/03/2013 °Elsevier Interactive Patient Education ©2016 Elsevier Inc. ° °

## 2015-11-28 ENCOUNTER — Ambulatory Visit (INDEPENDENT_AMBULATORY_CARE_PROVIDER_SITE_OTHER): Admitting: Pulmonary Disease

## 2015-11-28 ENCOUNTER — Encounter: Payer: Self-pay | Admitting: Pulmonary Disease

## 2015-11-28 VITALS — BP 154/100 | HR 129 | Ht 73.0 in | Wt 190.8 lb

## 2015-11-28 DIAGNOSIS — F5102 Adjustment insomnia: Secondary | ICD-10-CM

## 2015-11-28 MED ORDER — ZOLPIDEM TARTRATE 10 MG PO TABS: 10.0000 mg | ORAL_TABLET | Freq: Every evening | ORAL | Status: DC | PRN

## 2015-11-28 MED ORDER — ZOLPIDEM TARTRATE 10 MG PO TABS: ORAL_TABLET | ORAL | Status: DC

## 2015-11-28 NOTE — Addendum Note (Signed)
Addended by: Maisie FusGREEN, Lesleyann Fichter M on: 11/28/2015 03:30 PM   Modules accepted: Orders

## 2015-11-28 NOTE — Patient Instructions (Signed)
Follow up in 3 months

## 2015-11-28 NOTE — Progress Notes (Signed)
Past Surgical History He  has past surgical history that includes Vasectomy and Appendectomy.  Allergies  Allergen Reactions  . Diphenhydramine     insomnia    Family History His family history is not on file.  Social History He  reports that he quit smoking about 3 years ago. His smoking use included Cigarettes. He has a 15 pack-year smoking history. His smokeless tobacco use includes Snuff. He reports that he does not drink alcohol or use illicit drugs.  Review of systems Constitutional: Negative for fever and unexpected weight change.  HENT: Negative for congestion, dental problem, ear pain, nosebleeds, postnasal drip, rhinorrhea, sinus pressure, sneezing, sore throat and trouble swallowing.   Eyes: Negative for redness and itching.  Respiratory: Negative for cough, chest tightness, shortness of breath and wheezing.   Cardiovascular: Negative for palpitations and leg swelling.  Gastrointestinal: Negative for nausea and vomiting.  Genitourinary: Negative for dysuria.  Musculoskeletal: Negative for joint swelling.  Skin: Negative for rash.  Neurological: Negative for headaches.  Hematological: Does not bruise/bleed easily.  Psychiatric/Behavioral: Negative for dysphoric mood. The patient is not nervous/anxious.     Current Outpatient Prescriptions on File Prior to Visit  Medication Sig  . ARIPiprazole (ABILIFY) 20 MG tablet Take 10 mg by mouth every morning.  . Artificial Saliva AERS Use as directed 4 sprays in the mouth or throat 4 (four) times daily.  . Cholecalciferol 2000 UNITS TABS Take 1 tablet by mouth every morning.  . desvenlafaxine (PRISTIQ) 100 MG 24 hr tablet Take 300 mg by mouth daily.   . fluticasone (FLONASE) 50 MCG/ACT nasal spray Place into both nostrils daily.  Marland Kitchen. gabapentin (NEURONTIN) 600 MG tablet Take 600 mg by mouth 3 (three) times daily.   Marland Kitchen. liothyronine (CYTOMEL) 5 MCG tablet Take 5 mcg by mouth daily.  . methylphenidate 54 MG PO CR tablet Take 54 mg by  mouth every morning.  . Multiple Vitamin (MULTIVITAMIN WITH MINERALS) TABS tablet Take 1 tablet by mouth daily.  . polyvinyl alcohol (LIQUIFILM TEARS) 1.4 % ophthalmic solution Place 1 drop into both eyes 4 (four) times daily as needed for dry eyes.  Marland Kitchen. testosterone cypionate (DEPOTESTOTERONE CYPIONATE) 200 MG/ML injection Inject 200 mg into the muscle every Wednesday.  . zolpidem (AMBIEN) 10 MG tablet Take 10 mg by mouth at bedtime.  . pregabalin (LYRICA) 50 MG capsule Take 50 mg by mouth 3 (three) times daily.   No current facility-administered medications on file prior to visit.    Chief Complaint  Patient presents with  . SLEEP CONSULT    Referred by Dr Gerlene FeePiva for OSA. Sleep study done July 2016. Epworth Score: 0-2    Past medical history He  has a past medical history of Arthritis; PTSD (post-traumatic stress disorder); Anxiety; Nerve damage; Chronic pain; and OSA (obstructive sleep apnea).  Vital signs BP 154/100 mmHg  Pulse 129  Ht 6\' 1"  (1.854 m)  Wt 190 lb 12.8 oz (86.546 kg)  BMI 25.18 kg/m2  SpO2 97%  History of Present Illness Martin Daniel is a 40 y.o. male for evaluation of sleep problems.  He is a Cytogeneticistveteran.  He has history of PSTD and chronic pain related to his military experiences.  He is fully disabled.  He had sleep study last summer which showed severe sleep apnea with an AHI of 30.  He has been using CPAP and this helps.    He has been on [place].  [facility] and this [place] wasn't refilled.  He was advised he needed a sleep consultation to get [place].  When he uses [place] he goes to sleep at 1130 pm.  He falls asleep quickly.  He wakes up after 3 hours, and then takes another [place].  He gets out of bed at 730 am.  He feels okay in the morning.  He gets occasional headaches.  He denies sleep walking, sleep talking, bruxism, or nightmares.  There is no history of restless legs.  He denies sleep hallucinations, sleep paralysis, or cataplexy.  The  Epworth score is 2 out of 24.   Physical Exam:  General - No distress ENT - No sinus tenderness, no oral exudate, no LAN, no thyromegaly, TM clear, pupils equal/reactive, MP 3 Cardiac - s1s2 regular, no murmur, pulses symmetric Chest - No wheeze/rales/dullness, good air entry, normal respiratory excursion Back - No focal tenderness Abd - Soft, non-tender, no organomegaly, + bowel sounds Ext - No edema Neuro - Normal strength, cranial nerves intact Skin - No rashes, multiple tattooes Psych - Normal mood, and behavior  Discussion: He has hx of PTSD, anxiety, depression, and chronic pain.  As a result he requires the use of several psychotropic medications, including medications to help with his sleep.    He has hx of severe obstructive sleep apnea, and has done well with CPAP set up through the [place].  Assessment/plan:  Insomnia in setting of PTSD, anxiety, depression, and chronic pain. - refilled his Remus Loffler - advised him to d/w his providers at [facility] to get refills from there >> otherwise he can f/u here for refills - discussed sleep restriction, stimulus control, and relaxation techniques  Obstructive sleep apnea. - he is to continue CPAP >> he gets supplies through the [place]   Patient Instructions  Follow up in 3 months      Coralyn Helling, M.D. Pager [phone] 11/28/2015, 2:13 PM

## 2015-11-28 NOTE — Progress Notes (Signed)
   Subjective:    Patient ID: Bethann GooMichael D Police, male    DOB: 17-Oct-1975, 40 y.o.   MRN: [id number]  HPI    Review of Systems  Constitutional: Negative for fever and unexpected weight change.  HENT: Negative for congestion, dental problem, ear pain, nosebleeds, postnasal drip, rhinorrhea, sinus pressure, sneezing, sore throat and trouble swallowing.   Eyes: Negative for redness and itching.  Respiratory: Negative for cough, chest tightness, shortness of breath and wheezing.   Cardiovascular: Negative for palpitations and leg swelling.  Gastrointestinal: Negative for nausea and vomiting.  Genitourinary: Negative for dysuria.  Musculoskeletal: Negative for joint swelling.  Skin: Negative for rash.  Neurological: Negative for headaches.  Hematological: Does not bruise/bleed easily.  Psychiatric/Behavioral: Negative for dysphoric mood. The patient is not nervous/anxious.        Objective:   Physical Exam        Assessment & Plan:

## 2016-01-09 ENCOUNTER — Encounter (HOSPITAL_COMMUNITY): Payer: Self-pay | Admitting: Emergency Medicine

## 2016-01-09 ENCOUNTER — Emergency Department (HOSPITAL_COMMUNITY)
Admission: EM | Admit: 2016-01-09 | Discharge: 2016-01-09 | Disposition: A | Discharge status: Home / Self Care | Attending: Emergency Medicine | Admitting: Emergency Medicine

## 2016-01-09 DIAGNOSIS — H9201 Otalgia, right ear: Secondary | ICD-10-CM | POA: Diagnosis not present

## 2016-01-09 DIAGNOSIS — Z87891 Personal history of nicotine dependence: Secondary | ICD-10-CM | POA: Insufficient documentation

## 2016-01-09 NOTE — Discharge Instructions (Signed)
Read the information below.   At this time your ear drum is not perforated. There is some mild bulging of the right eardrum, this is likely secondary to your recent upper respiratory infection. Continue medications as prescribed. You can take Tylenol or Motrin as needed for pain relief. Use the prescribed medication as directed.  Please discuss all new medications with your pharmacist.   Follow-up with your primary care provider if her symptoms do not improve. You may return to the Emergency Department at any time for worsening condition or any new symptoms that concern you. Return to the emergency department if your symptoms do not improve, you develop a fever, you have difficulty or changes in hearing, you develop dizziness or lightheadedness.

## 2016-01-09 NOTE — ED Notes (Signed)
C/o right ear pain x 3 days. [facility] and was put on Amoxicillin. Pt is here today because the [facility] wasn't sure if the "eardrum was perforated or not". Pt to ED with service dog.

## 2016-01-09 NOTE — ED Provider Notes (Signed)
CSN: [id number]     Arrival date & time 01/09/16  [id number] History  By signing my name below, I, Rosario AdieWilliam Andrew Hiatt, attest that this documentation has been prepared under the direction and in the presence of Arvilla MeresAshley Tahliyah Anagnos, PA-C.   Electronically Signed: Rosario AdieWilliam Andrew Hiatt, ED Scribe. 01/09/2016. 9:41 AM.   Chief Complaint  Patient presents with  . Otalgia   The history is provided by the patient. No language interpreter was used.   HPI Comments: Martin Daniel is a 40 y.o. male with no significant PMHx who presents to the Emergency Department complaining of gradual onset, gradually worsening, constant, 7/10 right-sided otalgia x 3 days. [facility] day [facility], and was prescribed with a course of Amoxicillin. He notes that he is here today because the [facility] doctor was unsure if the pt had a perforated eardrum. He reports that he has had associated sensation of ear discharge, but has not been able to find any. His pain is exacerbated with swallowing. He notes that he has not been able to sleep well lately secondary to his pain waking him up. Pt reports that he was recently getting over URI-like symptoms. Pt denies any known trauma to his ear, recent air travel/scuba diving. He denies any ear ringing. Has not recently been swimming. He denies fever, chills, night sweats, or nasal congestion.    Past Medical History  Diagnosis Date  . Arthritis   . PTSD (post-traumatic stress disorder)   . Anxiety   . 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 Chronic pain   . OSA (obstructive sleep apnea)    Past Surgical History  Procedure Laterality Date  . Vasectomy    . Appendectomy     No family history on file. Social History  Substance Use Topics  . Smoking status: Former Smoker -- 1.00 packs/day for 15 years    Types: Cigarettes    Quit date: 07/08/2012  . Smokeless tobacco: Current User    Types: Snuff     Comment: Snuff since 2007  . Alcohol Use: No     Comment: QUIT 2007     Review of Systems  Constitutional: Negative for fever, chills and diaphoresis.  HENT: Positive for ear discharge and ear pain. Negative for congestion (nasal) and tinnitus.   Neurological: Negative for dizziness and light-headedness.    Allergies  Diphenhydramine  Home Medications   Prior to Admission medications   Medication Sig Start Date End Date Taking? Authorizing Provider  amLODipine (NORVASC) 10 MG tablet Take 10 mg by mouth daily.    Historical Provider, MD  ARIPiprazole (ABILIFY) 20 MG tablet Take 10 mg by mouth every morning.    Historical Provider, MD  Artificial Saliva AERS Use as directed 4 sprays in the mouth or throat 4 (four) times daily.    Historical Provider, MD  Cholecalciferol 2000 UNITS TABS Take 1 tablet by mouth every morning.    Historical Provider, MD  desvenlafaxine (PRISTIQ) 100 MG 24 hr tablet Take 300 mg by mouth daily.     Historical Provider, MD  fluticasone (FLONASE) 50 MCG/ACT nasal spray Place into both nostrils daily.    Historical Provider, MD  gabapentin (NEURONTIN) 600 MG tablet Take 600 mg by mouth 3 (three) times daily.     Historical Provider, MD  liothyronine (CYTOMEL) 5 MCG tablet Take 5 mcg by mouth daily.    Historical Provider, MD  methylphenidate 54 MG PO CR  tablet Take 54 mg by mouth every morning.    Historical Provider, MD  Multiple Vitamin (MULTIVITAMIN WITH MINERALS) TABS tablet Take 1 tablet by mouth daily.    Historical Provider, MD  polyvinyl alcohol (LIQUIFILM TEARS) 1.4 % ophthalmic solution Place 1 drop into both eyes 4 (four) times daily as needed for dry eyes.    Historical Provider, MD  pregabalin (LYRICA) 50 MG capsule Take 50 mg by mouth 3 (three) times daily.    Historical Provider, MD  testosterone cypionate (DEPOTESTOTERONE CYPIONATE) 200 MG/ML injection Inject 200 mg into the muscle every Wednesday.    Historical Provider, MD  zolpidem (AMBIEN) 10 MG tablet Take 1 tablet at bedtime and another 4 hours later if needed.  11/28/15   Coralyn HellingVineet Sood, MD   BP 153/99 mmHg  Pulse 113  Temp(Src) 97.6 F (36.4 C) (Oral)  Resp 18  SpO2 100%   Physical Exam  Constitutional: He appears well-developed and well-nourished. No distress.  HENT:  Head: Normocephalic and atraumatic.  Right Ear: External ear and ear canal normal. No lacerations. No drainage, swelling or tenderness. No foreign bodies. No mastoid tenderness. Tympanic membrane is bulging. Tympanic membrane is not injected, not perforated and not erythematous. A middle ear effusion ( mild, non-purulent) is present. No hemotympanum.  Left Ear: Tympanic membrane, external ear and ear canal normal.  Nose: Right sinus exhibits no maxillary sinus tenderness and no frontal sinus tenderness. Left sinus exhibits no maxillary sinus tenderness and no frontal sinus tenderness.  Mouth/Throat: Oropharynx is clear and moist. No oropharyngeal exudate.  Right TM: No hole or perforation noted. Slight fullness/bulging and fluid behind the ear, however it is not purulent in color.   Eyes: Conjunctivae are normal. Pupils are equal, round, and reactive to light. Right eye exhibits no discharge. Left eye exhibits no discharge. No scleral icterus.  Neck: Normal range of motion.  Pulmonary/Chest: Effort normal. No respiratory distress.  Lymphadenopathy:    He has no cervical adenopathy.  Neurological: He is alert.  Skin: Skin is warm and dry. He is not diaphoretic.  Psychiatric: He has a normal mood and affect. His behavior is normal.  Nursing note and vitals reviewed.  ED Course  Procedures (including critical care time)  DIAGNOSTIC STUDIES: Oxygen Saturation is 100% on RA, normal by my interpretation.   COORDINATION OF CARE: 9:36 AM-Discussed next steps with pt including continued usage of Amoxicillin and at home pain management medications. Pt verbalized understanding and is agreeable with the plan.   MDM   Final diagnoses:  Otalgia, right    Patient is afebrile and  nontoxic in NAD. His blood pressure and heart rate are mildly elevated, patient states he is currently being evaluated for possible high blood pressure. He has a blood pressure monitor at home. He states that he is "wound tight"and has not taken any of his medications this morning. On physical exam there is bulging of right TM with mild, non-purulent middle ear effusion. No injection or erythema noted. Low suspicion for otitis media or otitis externa. Perforation noted. Symptomatic management discussed. Encouraged follow-up with primary care provider. Discussed return precautions. Patient voiced understanding and is agreeable.  I personally performed the services described in this documentation, which was scribed in my presence. The recorded information has been reviewed and is accurate.     Lona KettleAshley Laurel Knolan Simien, [place] JerseyPA-C 01/09/16 [id number]  Melene Planan Floyd, DO 01/09/16 1135

## 2016-02-28 ENCOUNTER — Ambulatory Visit (INDEPENDENT_AMBULATORY_CARE_PROVIDER_SITE_OTHER): Admitting: Adult Health

## 2016-02-28 ENCOUNTER — Encounter: Payer: Self-pay | Admitting: Adult Health

## 2016-02-28 ENCOUNTER — Ambulatory Visit: Admitting: Pulmonary Disease

## 2016-02-28 DIAGNOSIS — G4733 Obstructive sleep apnea (adult) (pediatric): Secondary | ICD-10-CM | POA: Diagnosis not present

## 2016-02-28 DIAGNOSIS — G4700 Insomnia, unspecified: Secondary | ICD-10-CM | POA: Insufficient documentation

## 2016-02-28 MED ORDER — ZOLPIDEM TARTRATE 10 MG PO TABS: ORAL_TABLET | ORAL | 5 refills | Status: DC

## 2016-02-28 NOTE — Progress Notes (Signed)
Reviewed and agree with assessment/plan.  Coralyn HellingVineet Shantina Chronister, MD [facility] Pulmonary/Critical Care 02/28/2016, 12:22 PM Pager:  [phone]

## 2016-02-28 NOTE — Progress Notes (Signed)
Subjective:    Patient ID: Martin Daniel, male    DOB: [birth year], 40 y.o.   MRN: [id number]  HPI 40 year old male seen for sleep consult on 11/28/2015. Patient is a Cytogeneticistveteran. He has a history of post traumatic stress disorder. He has a Administrator, [employer] Services[employer] dog. He is fully disabled. He has known severe sleep apnea with previous sleep study done at the [facility]. He is on nocturnal C Pap.  02/28/2016 Follow up : Insomnia and OSA Patient presents for a three-month follow-up. He says that he has been doing well since last visit. He remains on Ambien in which he takes at bedtime and then if he wakes up he takes another one at 4 hours later. Seems to keep him asleep all night. He is on C Pap at bedtime. He gets his supplies and is followed at the [facility]. He says he's doing very well on C Pap and feels rested during the daytime. Needs refills of his Ambien. Healthy sleep regimen. Discussed with patient.    Past Medical History:  Diagnosis Date  . Anxiety   . Arthritis   . Chronic pain   . Nerve damage    lower abd and lower back  . OSA (obstructive sleep apnea)   . PTSD (post-traumatic stress disorder)    Current Outpatient Prescriptions on File Prior to Visit  Medication Sig Dispense Refill  . amLODipine (NORVASC) 10 MG tablet Take 10 mg by mouth daily.    . ARIPiprazole (ABILIFY) 20 MG tablet Take 10 mg by mouth every morning.    . Artificial Saliva AERS Use as directed 4 sprays in the mouth or throat 4 (four) times daily.    . Cholecalciferol 2000 UNITS TABS Take 1 tablet by mouth every morning.    . desvenlafaxine (PRISTIQ) 100 MG 24 hr tablet Take 300 mg by mouth daily.     . fluticasone (FLONASE) 50 MCG/ACT nasal spray Place into both nostrils daily.    Marland Kitchen. gabapentin (NEURONTIN) 600 MG tablet Take 600 mg by mouth 3 (three) times daily.     Marland Kitchen. liothyronine (CYTOMEL) 5 MCG tablet Take 5 mcg by mouth daily.    . methylphenidate 54 MG PO CR tablet Take 54 mg by mouth every morning.    . Multiple Vitamin  (MULTIVITAMIN WITH MINERALS) TABS tablet Take 1 tablet by mouth daily.    . polyvinyl alcohol (LIQUIFILM TEARS) 1.4 % ophthalmic solution Place 1 drop into both eyes 4 (four) times daily as needed for dry eyes.    . pregabalin (LYRICA) 50 MG capsule Take 50 mg by mouth 3 (three) times daily.    Marland Kitchen. testosterone cypionate (DEPOTESTOTERONE CYPIONATE) 200 MG/ML injection Inject 200 mg into the muscle every Wednesday.    . zolpidem (AMBIEN) 10 MG tablet Take 1 tablet at bedtime and another 4 hours later if needed. 60 tablet 5   No current facility-administered medications on file prior to visit.      Review of Systems    Constitutional:   No  weight loss, night sweats,  Fevers, chills, fatigue, or  lassitude.  HEENT:   No headaches,  Difficulty swallowing,  Tooth/dental problems, or  Sore throat,                No sneezing, itching, ear ache, nasal congestion, post nasal drip,   CV:  No chest pain,  Orthopnea, PND, swelling in lower extremities, anasarca, dizziness, palpitations, syncope.   GI  No heartburn, indigestion, abdominal pain, nausea, vomiting, diarrhea,  change in bowel habits, loss of appetite, bloody stools.   Resp: No shortness of breath with exertion or at rest.  No excess mucus, no productive cough,  No non-productive cough,  No coughing up of blood.  No change in color of mucus.  No wheezing.  No chest wall deformity  Skin: no rash or lesions.  GU: no dysuria, change in color of urine, no urgency or frequency.  No flank pain, no hematuria   MS:  No joint pain or swelling.  No decreased range of motion.  No back pain.  Psych:  No change in mood or affect. No depression or anxiety.  No memory loss.      Objective:   Physical Exam Vitals:   02/28/16 1132  BP: 124/84  Pulse: (!) 129  SpO2: 97%  Weight: 191 lb (86.6 kg)  Height: 6\' 1"  (1.854 m)   GEN: A/Ox3; pleasant , NAD, well nourished , service dog with him    HEENT:  [place]/AT,  EACs-clear, TMs-wnl, NOSE-clear,  THROAT-clear, no lesions, no postnasal drip or exudate noted.   NECK:  Supple w/ fair ROM; no JVD; normal carotid impulses w/o bruits; no thyromegaly or nodules palpated; no lymphadenopathy.    RESP  Clear  P & A; w/o, wheezes/ rales/ or rhonchi. no accessory muscle use, no dullness to percussion  CARD:  RRR, no m/r/g  , no peripheral edema, pulses intact, no cyanosis or clubbing.  GI:   Soft & nt; nml bowel sounds; no organomegaly or masses detected.   Musco: Warm bil, no deformities or joint swelling noted.   Neuro: alert, no focal deficits noted.    Skin: Warm, no lesions or rashes, multiple tattoos and piercing.   Anyra Kaufman NP-C   Pulmonary and Critical Care  02/28/2016        Assessment & Plan:

## 2016-02-28 NOTE — Patient Instructions (Signed)
Continue on current regimen  Follow up with Dr. Craige CottaSood  In 6 months and As needed

## 2016-02-28 NOTE — Assessment & Plan Note (Signed)
Cont w/ CPAP At bedtime   [facility]

## 2016-02-28 NOTE — Assessment & Plan Note (Signed)
Insomnia well controlled with ambien .  Cont on CPAP for OSA Pt education on healthy sleep regimen   Plan  Patient Instructions  Continue on current regimen  Follow up with Dr. Craige CottaSood  In 6 months and As needed

## 2016-09-08 ENCOUNTER — Encounter (HOSPITAL_COMMUNITY): Payer: Self-pay | Admitting: Emergency Medicine

## 2016-09-08 ENCOUNTER — Emergency Department (HOSPITAL_COMMUNITY)
Admission: EM | Admit: 2016-09-08 | Discharge: 2016-09-08 | Disposition: A | Discharge status: Home / Self Care | Attending: Emergency Medicine | Admitting: Emergency Medicine

## 2016-09-08 DIAGNOSIS — R41841 Cognitive communication deficit: Secondary | ICD-10-CM | POA: Insufficient documentation

## 2016-09-08 DIAGNOSIS — Z87891 Personal history of nicotine dependence: Secondary | ICD-10-CM | POA: Insufficient documentation

## 2016-09-08 DIAGNOSIS — Z79899 Other long term (current) drug therapy: Secondary | ICD-10-CM | POA: Insufficient documentation

## 2016-09-08 DIAGNOSIS — R4189 Other symptoms and signs involving cognitive functions and awareness: Secondary | ICD-10-CM

## 2016-09-08 LAB — I-STAT CHEM 8, ED
BUN: 12 mg/dL (ref 6–20)
Calcium, Ion: 1.18 mmol/L (ref 1.15–1.40)
Chloride: 99 mmol/L — ABNORMAL LOW (ref 101–111)
Creatinine, Ser: 1.3 mg/dL — ABNORMAL HIGH (ref 0.61–1.24)
Glucose, Bld: 116 mg/dL — ABNORMAL HIGH (ref 65–99)
HCT: 55 % — ABNORMAL HIGH (ref 39.0–52.0)
Hemoglobin: 18.7 g/dL — ABNORMAL HIGH (ref 13.0–17.0)
Potassium: 3.6 mmol/L (ref 3.5–5.1)
Sodium: 139 mmol/L (ref 135–145)
TCO2: 29 mmol/L (ref 0–100)

## 2016-09-08 LAB — URINALYSIS, DIPSTICK ONLY
Bilirubin Urine: NEGATIVE
GLUCOSE, UA: NEGATIVE mg/dL
Ketones, ur: NEGATIVE mg/dL
Nitrite: NEGATIVE
PH: 6 (ref 5.0–8.0)
Protein, ur: 30 mg/dL — AB
SPECIFIC GRAVITY, URINE: 1.017 (ref 1.005–1.030)

## 2016-09-08 LAB — CBC WITH DIFFERENTIAL/PLATELET
BASOS PCT: 0 %
Basophils Absolute: 0 10*3/uL (ref 0.0–0.1)
EOS ABS: 0.1 10*3/uL (ref 0.0–0.7)
Eosinophils Relative: 0 %
HCT: 51.2 % (ref 39.0–52.0)
Hemoglobin: 17.9 g/dL — ABNORMAL HIGH (ref 13.0–17.0)
Lymphocytes Relative: 8 %
Lymphs Abs: 1.4 10*3/uL (ref 0.7–4.0)
MCH: 31.3 pg (ref 26.0–34.0)
MCHC: 35 g/dL (ref 30.0–36.0)
MCV: 89.5 fL (ref 78.0–100.0)
MONO ABS: 1.5 10*3/uL — AB (ref 0.1–1.0)
Monocytes Relative: 9 %
Neutro Abs: 14.1 10*3/uL — ABNORMAL HIGH (ref 1.7–7.7)
Neutrophils Relative %: 83 %
Platelets: 293 10*3/uL (ref 150–400)
RBC: 5.72 MIL/uL (ref 4.22–5.81)
RDW: 12.9 % (ref 11.5–15.5)
WBC: 17.1 10*3/uL — ABNORMAL HIGH (ref 4.0–10.5)

## 2016-09-08 LAB — RAPID URINE DRUG SCREEN, HOSP PERFORMED
Amphetamines: POSITIVE — AB
BARBITURATES: NOT DETECTED
BENZODIAZEPINES: POSITIVE — AB
Cocaine: NOT DETECTED
Opiates: NOT DETECTED
Tetrahydrocannabinol: NOT DETECTED

## 2016-09-08 MED ORDER — ACETAMINOPHEN 500 MG PO TABS
1000.0000 mg | ORAL_TABLET | Freq: Once | ORAL | Status: AC
  Administered 2016-09-08: 1000 mg via ORAL
  Filled 2016-09-08: qty 2

## 2016-09-08 MED ORDER — NAPROXEN 250 MG PO TABS
375.0000 mg | ORAL_TABLET | Freq: Once | ORAL | Status: AC
  Administered 2016-09-08: 375 mg via ORAL
  Filled 2016-09-08: qty 2

## 2016-09-08 MED ORDER — ZOLPIDEM TARTRATE 5 MG PO TABS
10.0000 mg | ORAL_TABLET | Freq: Every evening | ORAL | Status: DC | PRN
  Administered 2016-09-08: 10 mg via ORAL
  Filled 2016-09-08: qty 2

## 2016-09-08 NOTE — Discharge Instructions (Signed)
Do not drive or operate heavy machinery until cleared by your doctor

## 2016-09-08 NOTE — ED Provider Notes (Signed)
[facility]-EMERGENCY DEPT Provider Note   CSN: [id number] Arrival date & time: 09/08/16  1739     History   Chief Complaint Chief Complaint  Patient presents with  . Loss of Consciousness  . Seizures    HPI Martin Daniel is a 41 y.o. male.  HPI  Patient arrives via EMS after being found confused at home Patient has a history of a seizure approximately one year ago but states was secondary to a medication interaction from gabapentin to pregabalin He states he has had a negative MRI  Patient cannot remember the entire event and became alertness in transport The last thing he can remember is [facility] and then feeling foggy When EMS arrived, patient was confused on the ground He could not answer questions correctly Without any intervention patient regained orientation in route  Patient's point of care glucose was within normal limits on their arrival  He was slightly tachycardic but normotensive   Patient denies any drug use, alcohol use or withdrawal  Review of controlled substance agreement shows pt on dextroamp-amphetamin, depo-testosterone, zolpidem, alprazolam; no narcotics He states he did stop taking his alprazolam several weeks ago  He used to use high-dose narcotics but stopped them several years ago   He does suffer from depression but denies any suicidality or self inflicting behavior   CTH negative in 2015  During this event, patient did hit his head but denies any vomiting, neck stiffness, neck pain, pain elsewhere, vomiting, weakness, numbness. He states he has mild pain where the impact was  Has not been sleeping well in several days (did not fill [place])  Past Medical History:  Diagnosis Date  . Anxiety   . Arthritis   . Chronic pain   . Nerve damage    lower abd and lower back  . OSA (obstructive sleep apnea)   . PTSD (post-traumatic stress disorder)     Patient Active Problem List   Diagnosis Date Noted  . Insomnia 02/28/2016    . OSA (obstructive sleep apnea) 02/28/2016  . Acute encephalopathy 03/06/2014  . PTSD (post-traumatic stress disorder) 03/06/2014    Past Surgical History:  Procedure Laterality Date  . APPENDECTOMY    . VASECTOMY         Home Medications    Prior to Admission medications   Medication Sig Start Date End Date Taking? Authorizing Provider  ALPRAZolam Prudy Feeler(XANAX) 1 MG tablet Take 3 mg by mouth 3 (three) times daily. 07/23/11  Yes Historical Provider, MD  amLODipine (NORVASC) 10 MG tablet Take 10 mg by mouth daily.   Yes Historical Provider, MD  ARIPiprazole (ABILIFY) 20 MG tablet Take 20 mg by mouth every morning.    Yes Historical Provider, MD  Cholecalciferol 2000 UNITS TABS Take 1 tablet by mouth every morning.   Yes Historical Provider, MD  desvenlafaxine (PRISTIQ) 100 MG 24 hr tablet Take 300 mg by mouth daily.    Yes Historical Provider, MD  fluticasone (FLONASE) 50 MCG/ACT nasal spray Place into both nostrils daily.   Yes Historical Provider, MD  liothyronine (CYTOMEL) 5 MCG tablet Take 5 mcg by mouth daily.   Yes Historical Provider, MD  methylphenidate 54 MG PO CR tablet Take 108 mg by mouth every morning.    Yes Historical Provider, MD  Multiple Vitamin (MULTIVITAMIN WITH MINERALS) TABS tablet Take 1 tablet by mouth daily.   Yes Historical Provider, MD  polyvinyl alcohol (LIQUIFILM TEARS) 1.4 % ophthalmic solution Place 1 drop into both eyes 4 (  four) times daily as needed for dry eyes.   Yes Historical Provider, MD  testosterone cypionate (DEPOTESTOTERONE CYPIONATE) 200 MG/ML injection Inject 200 mg into the muscle every Wednesday.   Yes Historical Provider, MD  zolpidem (AMBIEN) 10 MG tablet Take 1 tablet at bedtime and another 4 hours later if needed. Patient taking differently: Take 20 mg by mouth at bedtime.  02/28/16  Yes Coralyn Helling, MD  gabapentin (NEURONTIN) 600 MG tablet Take 600 mg by mouth 3 (three) times daily.     Historical Provider, MD    Family History No family  history on file.  Social History Social History  Substance Use Topics  . Smoking status: Former Smoker    Packs/day: 1.00    Years: 15.00    Types: Cigarettes    Quit date: 07/08/2012  . Smokeless tobacco: Current User    Types: Snuff     Comment: Snuff since 2007  . Alcohol use No     Comment: QUIT 2007     Allergies   Pregabalin and Diphenhydramine   Review of Systems Review of Systems  Constitutional: Negative for fever.  Allergic/Immunologic: Negative for immunocompromised state.  All other systems reviewed and are negative.    Physical Exam Updated Vital Signs Patient Vitals for the past 24 hrs:  BP Temp Temp src Pulse Resp SpO2 Height Weight  09/08/16 2051 - 98.6 F (37 C) Oral - - - - -  09/08/16 2045 141/95 - - 107 17 97 % - -  09/08/16 2030 133/98 - - 110 22 99 % - -  09/08/16 2015 137/90 - - 101 17 99 % - -  09/08/16 2000 140/99 - - 105 21 99 % - -  09/08/16 1930 137/91 - - 101 18 98 % - -  09/08/16 1915 128/98 - - 102 17 100 % - -  09/08/16 1845 135/96 - - 103 24 100 % - -  09/08/16 1815 132/82 - - 96 20 98 % - -  09/08/16 1803 - - - - - - 5\' 9"  (1.753 m) 86.6 kg  09/08/16 1745 135/90 - - 101 20 98 % - -  09/08/16 1743 - - - - - 100 % - -     Physical Exam  Constitutional: He appears well-developed and well-nourished. No distress.  HENT:  Head: Normocephalic.  Right Ear: External ear normal.  Left Ear: External ear normal.  Nose: Nose normal.  Mouth/Throat: Oropharynx is clear and moist. No oropharyngeal exudate.  Large parietal hematoma under skin without crepitus  Eyes: Conjunctivae are normal. Pupils are equal, round, and reactive to light. Right eye exhibits no discharge. Left eye exhibits no discharge.  Pupils 4mm bilaterally No tongue laceration  Neck: Normal range of motion. Neck supple.  Cardiovascular: Normal rate and regular rhythm.   No murmur heard. Pulmonary/Chest: Effort normal and breath sounds normal. No respiratory distress.    Abdominal: Soft. Bowel sounds are normal. He exhibits no distension and no mass. There is no tenderness. There is no rebound and no guarding.  Musculoskeletal: He exhibits no edema or tenderness.  Palpated face, c/t/l spine, chest, clavicles, extremitiesx4, abdomen/pelvis wo significant tenderness or swelling except in right parietal side of scalp  Neurological: He is alert. He has normal strength. He displays normal reflexes. No cranial nerve deficit or sensory deficit. He exhibits normal muscle tone. Coordination and gait normal. GCS eye subscore is 4. GCS verbal subscore is 5. GCS motor subscore is 6.  Skin: Skin is  warm. He is not diaphoretic.  Psychiatric: He has a normal mood and affect.  Nursing note and vitals reviewed.    ED Treatments / Results  Labs (all labs ordered are listed, but only abnormal results are displayed) Labs Reviewed  RAPID URINE DRUG SCREEN, HOSP PERFORMED - Abnormal; Notable for the following:       Result Value   Benzodiazepines POSITIVE (*)    Amphetamines POSITIVE (*)    All other components within normal limits  URINALYSIS, DIPSTICK ONLY - Abnormal; Notable for the following:    APPearance HAZY (*)    Hgb urine dipstick MODERATE (*)    Protein, ur 30 (*)    Leukocytes, UA TRACE (*)    All other components within normal limits  CBC WITH DIFFERENTIAL/PLATELET - Abnormal; Notable for the following:    WBC 17.1 (*)    Hemoglobin 17.9 (*)    Neutro Abs 14.1 (*)    Monocytes Absolute 1.5 (*)    All other components within normal limits  I-STAT CHEM 8, ED - Abnormal; Notable for the following:    Chloride 99 (*)    Creatinine, Ser 1.30 (*)    Glucose, Bld 116 (*)    Hemoglobin 18.7 (*)    HCT 55.0 (*)    All other components within normal limits    EKG  EKG Interpretation  Date/Time:  Sunday September 08 2016 17:41:41 EST Ventricular Rate:  100 PR Interval:    QRS Duration: 100 QT Interval:  345 QTC Calculation: 445 R Axis:   73 Text  Interpretation:  Sinus tachycardia RSR' in V1 or V2, probably normal variant Probable anteroseptal infarct, old No significant change since last tracing Confirmed by [facility]  MD, Alphonzo Lemmings ([id number]) on 09/08/2016 7:09:25 PM       Radiology No results found.  Procedures Procedures (including critical care time)  Medications Ordered in ED Medications  zolpidem (AMBIEN) tablet 10 mg (10 mg Oral Given 09/08/16 2059)  naproxen (NAPROSYN) tablet 375 mg (375 mg Oral Given 09/08/16 1818)  acetaminophen (TYLENOL) tablet 1,000 mg (1,000 mg Oral Given 09/08/16 1818)     Initial Impression / Assessment and Plan / ED Course  I have reviewed the triage vital signs and the nursing notes.  Pertinent labs & imaging results that were available during my care of the patient were reviewed by me and considered in my medical decision making (see chart for details).     CT [place] rule negative, no indication for emergent CT head Patient likely had a seizure given the post ictal period. History of seizures and aura  UDS positive for benzo, amphetamines - states not withdrawing from benzos and not a heavy etoh user, clinically does not appear to be in withdrawal ua negative for infection, no meningitis or infectious sx suspected otherwise Electrolytes ok although Cr slightly elevated Pt to fu with neurology, no driving or heavy machinery until cleared Not sleeping much recently so perhaps this was trigger Dc in good condition  Final Clinical Impressions(s) / ED Diagnoses   Final diagnoses:  Spell of altered cognition    New Prescriptions Discharge Medication List as of 09/08/2016  8:38 PM       Sidney Ace, MD 09/08/16 5784    Gwyneth Sprout, MD 09/09/16 0004

## 2016-09-08 NOTE — ED Triage Notes (Signed)
[facility] after mother found him unresponsive on the floor for an unknown amount of time. Hx of 1 seizure last year, does not take any anti--seizure medications. Hematoma present to R top of head. No visible mouth trauma, no incontinence. Only last recalls eating lunch and coming home. A&ox4 on ED arrival

## 2018-05-10 ENCOUNTER — Emergency Department (HOSPITAL_COMMUNITY)
Admission: EM | Admit: 2018-05-10 | Discharge: 2018-05-11 | Disposition: A | Discharge status: Home / Self Care | Payer: No Typology Code available for payment source | Attending: Emergency Medicine | Admitting: Emergency Medicine

## 2018-05-10 ENCOUNTER — Encounter (HOSPITAL_COMMUNITY): Payer: Self-pay | Admitting: Emergency Medicine

## 2018-05-10 ENCOUNTER — Emergency Department (HOSPITAL_COMMUNITY): Payer: No Typology Code available for payment source

## 2018-05-10 ENCOUNTER — Other Ambulatory Visit: Payer: Self-pay

## 2018-05-10 DIAGNOSIS — G510 Bell's palsy: Secondary | ICD-10-CM | POA: Insufficient documentation

## 2018-05-10 DIAGNOSIS — R200 Anesthesia of skin: Secondary | ICD-10-CM | POA: Diagnosis present

## 2018-05-10 DIAGNOSIS — Z79899 Other long term (current) drug therapy: Secondary | ICD-10-CM | POA: Diagnosis not present

## 2018-05-10 DIAGNOSIS — F17220 Nicotine dependence, chewing tobacco, uncomplicated: Secondary | ICD-10-CM | POA: Diagnosis not present

## 2018-05-10 LAB — I-STAT TROPONIN, ED: TROPONIN I, POC: 0 ng/mL (ref 0.00–0.08)

## 2018-05-10 LAB — CBC WITH DIFFERENTIAL/PLATELET
ABS IMMATURE GRANULOCYTES: 0.11 10*3/uL — AB (ref 0.00–0.07)
BASOS ABS: 0.1 10*3/uL (ref 0.0–0.1)
Basophils Relative: 1 %
EOS PCT: 1 %
Eosinophils Absolute: 0.1 10*3/uL (ref 0.0–0.5)
HEMATOCRIT: 54.8 % — AB (ref 39.0–52.0)
HEMOGLOBIN: 18.7 g/dL — AB (ref 13.0–17.0)
IMMATURE GRANULOCYTES: 1 %
LYMPHS ABS: 2.9 10*3/uL (ref 0.7–4.0)
LYMPHS PCT: 20 %
MCH: 30.1 pg (ref 26.0–34.0)
MCHC: 34.1 g/dL (ref 30.0–36.0)
MCV: 88.1 fL (ref 80.0–100.0)
Monocytes Absolute: 1.4 10*3/uL — ABNORMAL HIGH (ref 0.1–1.0)
Monocytes Relative: 10 %
NEUTROS ABS: 10 10*3/uL — AB (ref 1.7–7.7)
NRBC: 0 % (ref 0.0–0.2)
Neutrophils Relative %: 67 %
Platelets: 424 10*3/uL — ABNORMAL HIGH (ref 150–400)
RBC: 6.22 MIL/uL — AB (ref 4.22–5.81)
RDW: 13.2 % (ref 11.5–15.5)
WBC: 14.6 10*3/uL — AB (ref 4.0–10.5)

## 2018-05-10 LAB — BASIC METABOLIC PANEL
ANION GAP: 11 (ref 5–15)
BUN: 14 mg/dL (ref 6–20)
CO2: 22 mmol/L (ref 22–32)
Calcium: 9.6 mg/dL (ref 8.9–10.3)
Chloride: 103 mmol/L (ref 98–111)
Creatinine, Ser: 1.31 mg/dL — ABNORMAL HIGH (ref 0.61–1.24)
GFR calc non Af Amer: >60 mL/min (ref >60)
Glucose, Bld: 94 mg/dL (ref 70–99)
POTASSIUM: 4.4 mmol/L (ref 3.5–5.1)
Sodium: 136 mmol/L (ref 135–145)

## 2018-05-10 LAB — I-STAT CG4 LACTIC ACID, ED: LACTIC ACID, VENOUS: 1.65 mmol/L (ref 0.5–1.9)

## 2018-05-10 LAB — CBG MONITORING, ED: Glucose-Capillary: 107 mg/dL — ABNORMAL HIGH (ref 70–99)

## 2018-05-10 LAB — PROTIME-INR
INR: 0.88
PROTHROMBIN TIME: 11.9 s (ref 11.4–15.2)

## 2018-05-10 MED ORDER — VALACYCLOVIR HCL 500 MG PO TABS
1000.0000 mg | ORAL_TABLET | Freq: Once | ORAL | Status: AC
  Administered 2018-05-11: 1000 mg via ORAL
  Filled 2018-05-10: qty 2

## 2018-05-10 MED ORDER — ARTIFICIAL TEARS OPHTHALMIC OINT
TOPICAL_OINTMENT | OPHTHALMIC | 1 refills | Status: AC | PRN
Start: 2018-05-10 — End: ?

## 2018-05-10 MED ORDER — PREDNISONE 10 MG PO TABS: 60.0000 mg | ORAL_TABLET | Freq: Every day | ORAL | 0 refills | Status: AC

## 2018-05-10 MED ORDER — VALACYCLOVIR HCL 1 G PO TABS: 1000.0000 mg | ORAL_TABLET | Freq: Three times a day (TID) | ORAL | 0 refills | Status: AC

## 2018-05-10 MED ORDER — PREDNISONE 20 MG PO TABS
60.0000 mg | ORAL_TABLET | Freq: Once | ORAL | Status: AC
  Administered 2018-05-11: 60 mg via ORAL
  Filled 2018-05-10: qty 3

## 2018-05-10 NOTE — Discharge Instructions (Signed)
Please return for any problem. Follow up with your regular neurologist at the [facility] tomorrow.

## 2018-05-10 NOTE — ED Triage Notes (Addendum)
Pt arriving POV with complaints of left sided facial drooping and right leg weakness. Pt having difficulty closing left eye appropriately and has some slurred speech. Pt reports [facility] around noon today for evaluation and advice on whether he should go to the emergency room. [facility] reports [facility] told him due to his reported symptoms, he would need to be seen emergently.

## 2018-05-10 NOTE — ED Provider Notes (Signed)
[facility] Note   CSN: [id number] Arrival date & time: 05/10/18  2055   An emergency department physician performed an initial assessment on this suspected stroke patient at 2120.  History   Chief Complaint Chief Complaint  Patient presents with  . Possible Stroke    HPI Martin Daniel is a 42 y.o. male.  42 year old male with prior medical history as detailed below presents for evaluation of right sided facial numbness.  Patient reports that he noticed that his right face felt numb earlier today.  He first noticed the gradual onset of symptoms sometime around noon.  Over the course of the afternoon he felt like his right eye was not closing appropriately.  He does report feeling like his right eye is dry and itchy.  He denies other focal complaint.  Denies visual change, slurred speech, focal weakness, prior history of CVA or TIA, or other acute medical complaint.  He denies prior history of Bell's palsy.    The history is provided by the patient and medical records.  Neurologic Problem  This is a new problem. The current episode started 6 to 12 hours ago. The problem occurs rarely. The problem has not changed since onset.Pertinent negatives include no chest pain, no abdominal pain, no headaches and no shortness of breath. Nothing aggravates the symptoms. Nothing relieves the symptoms. He has tried nothing for the symptoms.    Past Medical History:  Diagnosis Date  . Anxiety   . Arthritis   . Chronic pain   . Nerve damage    lower abd and lower back  . OSA (obstructive sleep apnea)   . PTSD (post-traumatic stress disorder)     Patient Active Problem List   Diagnosis Date Noted  . Insomnia 02/28/2016  . OSA (obstructive sleep apnea) 02/28/2016  . Acute encephalopathy 03/06/2014  . PTSD (post-traumatic stress disorder) 03/06/2014    Past Surgical History:  Procedure Laterality Date  . APPENDECTOMY    . VASECTOMY           Home Medications    Prior to Admission medications   Medication Sig Start Date End Date Taking? Authorizing Provider  ALPRAZolam Prudy Feeler) 1 MG tablet Take 3 mg by mouth 3 (three) times daily. 07/23/11   [provider]  amLODipine (NORVASC) 10 MG tablet Take 10 mg by mouth daily.    [provider]  ARIPiprazole (ABILIFY) 20 MG tablet Take 20 mg by mouth every morning.     [provider]  Cholecalciferol 2000 UNITS TABS Take 1 tablet by mouth every morning.    [provider]  desvenlafaxine (PRISTIQ) 100 MG 24 hr tablet Take 300 mg by mouth daily.     [provider]  fluticasone (FLONASE) 50 MCG/ACT nasal spray Place into both nostrils daily.    [provider]  gabapentin (NEURONTIN) 600 MG tablet Take 600 mg by mouth 3 (three) times daily.     [provider]  liothyronine (CYTOMEL) 5 MCG tablet Take 5 mcg by mouth daily.    [provider]  methylphenidate 54 MG PO CR tablet Take 108 mg by mouth every morning.     [provider]  Multiple Vitamin (MULTIVITAMIN WITH MINERALS) TABS tablet Take 1 tablet by mouth daily.    [provider]  polyvinyl alcohol (LIQUIFILM TEARS) 1.4 % ophthalmic solution Place 1 drop into both eyes 4 (four) times daily as needed for dry eyes.    [provider]  testosterone cypionate (DEPOTESTOTERONE CYPIONATE) 200 MG/ML injection Inject 200 mg into the muscle every Wednesday.    [provider]  zolpidem (AMBIEN) 10 MG tablet Take 1 tablet at bedtime and another 4 hours later if needed. Patient taking differently: Take 20 mg by mouth at bedtime.  02/28/16   Coralyn Helling, MD    Family History No family history on file.  Social History Social History   Tobacco Use  . Smoking status: Former Smoker    Packs/day: 1.00    Years: 15.00    Pack years: 15.00    Types: Cigarettes    Last attempt to quit: 07/08/2012    Years since quitting: 5.8   . Smokeless tobacco: Current User    Types: Snuff  . Tobacco comment: Snuff since 2007  Substance Use Topics  . Alcohol use: No    Alcohol/week: 0.0 standard drinks    Comment: QUIT 2007  . Drug use: No     Allergies   Pregabalin and Diphenhydramine   Review of Systems Review of Systems  Respiratory: Negative for shortness of breath.   Cardiovascular: Negative for chest pain.  Gastrointestinal: Negative for abdominal pain.  Neurological: Negative for headaches.  All other systems reviewed and are negative.    Physical Exam Updated Vital Signs BP (!) 144/77   Pulse 100   Resp (!) 22   SpO2 99%   Physical Exam  Constitutional: He is oriented to person, place, and time. He appears well-developed and well-nourished. No distress.  HENT:  Head: Normocephalic and atraumatic.  Mouth/Throat: Oropharynx is clear and moist.  Eyes: Pupils are equal, round, and reactive to light. Conjunctivae and EOM are normal.  Neck: Normal range of motion. Neck supple.  Cardiovascular: Normal rate, regular rhythm and normal heart sounds.  Pulmonary/Chest: Effort normal and breath sounds normal. No respiratory distress.  Abdominal: Soft. He exhibits no distension. There is no tenderness.  Musculoskeletal: Normal range of motion. He exhibits no edema or deformity.  Neurological: He is alert and oriented to person, place, and time.  AOX4 Normal Speech Mild right facial droop (without sparing of the right forehead) VAN negative 5/5 strength to BUE/BLE  Normal Gait  Skin: Skin is warm and dry.  Psychiatric: He has a normal mood and affect.  Nursing note and vitals reviewed.    ED Treatments / Results  Labs (all labs ordered are listed, but only abnormal results are displayed) Labs Reviewed  CBC WITH DIFFERENTIAL/PLATELET - Abnormal; Notable for the following components:      Result Value   WBC 14.6 (*)    RBC 6.22 (*)    Hemoglobin 18.7 (*)    HCT 54.8 (*)    Platelets 424 (*)     Neutro Abs 10.0 (*)    Monocytes Absolute 1.4 (*)    Abs Immature Granulocytes 0.11 (*)    All other components within normal limits  CBG MONITORING, ED - Abnormal; Notable for the following components:   Glucose-Capillary 107 (*)    All other components within normal limits  PROTIME-INR  BASIC METABOLIC PANEL  I-STAT TROPONIN, ED  I-STAT CG4 LACTIC ACID, ED  I-STAT CG4 LACTIC ACID, ED    EKG EKG Interpretation  Date/Time:  Sunday May 10 2018 22:25:01 EST Ventricular Rate:  93 PR Interval:    QRS Duration: 110 QT Interval:  344 QTC Calculation: 428 R Axis:   58 Text Interpretation:  Sinus rhythm RSR' in V1 or V2, right VCD or RVH Confirmed by  Kristine Royal ([id number]) on 05/10/2018 10:27:23 PM   Radiology No results found.  Procedures Procedures (including critical care time)  Medications Ordered in ED Medications - No data to display   Initial Impression / Assessment and Plan / ED Course  I have reviewed the triage vital signs and the nursing notes.  Pertinent labs & imaging results that were available during my care of the patient were reviewed by me and considered in my medical decision making (see chart for details).    MDM  Screen complete  Patient is presenting for evaluation of right-sided facial numbness.  Presentation is most consistent with likely Bell's palsy.  Screening labs do not suggest other acute pathology.  CT imaging does not suggest other pathology.  Case discussed with neurology.  They agree with plan of care and need for close follow-up with outpatient neurology.  Patient has established neurology care at the [facility].  He has a prior history of seizure.  He reports to me that he will contact his neurologist tomorrow morning.  Of note, patient with multiple facial and body piercings.  Patient advised that he may require MRI imaging as outpatient.  He is advised that he should remove as many of his piercings as possible prior to MRI imaging.  He  reports that he will visit his tattoo shop and get his piercings removed prior to obtaining MRI imaging. He reports the he is actually unable to remove all of his piercings and implants without specialized tools.      Final Clinical Impressions(s) / ED Diagnoses   Final diagnoses:  Bell's palsy    ED Discharge Orders         Ordered    artificial tears (LACRILUBE) OINT ophthalmic ointment  Every  3 hours PRN     05/10/18 2358    valACYclovir (VALTREX) 1000 MG tablet  3 times daily     05/10/18 2358    predniSONE (DELTASONE) 10 MG tablet  Daily     05/10/18 2358           Wynetta Fines, MD 05/11/18 0000

## 2018-05-11 NOTE — ED Notes (Signed)
Pt ambulated out of the ED in no distress 

## 2018-08-26 ENCOUNTER — Encounter: Payer: Self-pay | Admitting: Neurology

## 2018-10-30 ENCOUNTER — Telehealth: Payer: Self-pay | Admitting: Neurology

## 2018-10-30 NOTE — Telephone Encounter (Signed)
Patient is calling in wanting to speak with someone about an MRI he wants ordered. Please call him back. Thanks!

## 2018-11-02 NOTE — Telephone Encounter (Signed)
Pt called with no answer voice mail left for pt to call back  

## 2018-11-03 NOTE — Progress Notes (Signed)
[facility]

## 2018-11-04 ENCOUNTER — Other Ambulatory Visit: Payer: Self-pay

## 2018-11-04 ENCOUNTER — Telehealth: Payer: Non-veteran care | Admitting: Neurology

## 2018-11-04 ENCOUNTER — Telehealth: Payer: Self-pay

## 2018-11-04 NOTE — Telephone Encounter (Signed)
Pt called he was no show for appointment voice mail left for him to call the office to reschedule his appointment

## 2018-11-06 ENCOUNTER — Ambulatory Visit: Payer: Non-veteran care | Admitting: Neurology

## 2018-12-15 DIAGNOSIS — F32A Depression, unspecified: Secondary | ICD-10-CM | POA: Insufficient documentation

## 2018-12-15 DIAGNOSIS — G8929 Other chronic pain: Secondary | ICD-10-CM | POA: Insufficient documentation

## 2018-12-15 DIAGNOSIS — F329 Major depressive disorder, single episode, unspecified: Secondary | ICD-10-CM | POA: Insufficient documentation

## 2018-12-15 DIAGNOSIS — E349 Endocrine disorder, unspecified: Secondary | ICD-10-CM | POA: Insufficient documentation

## 2018-12-15 DIAGNOSIS — E895 Postprocedural testicular hypofunction: Secondary | ICD-10-CM | POA: Insufficient documentation

## 2018-12-15 DIAGNOSIS — J309 Allergic rhinitis, unspecified: Secondary | ICD-10-CM | POA: Insufficient documentation

## 2018-12-15 DIAGNOSIS — E7800 Pure hypercholesterolemia, unspecified: Secondary | ICD-10-CM | POA: Insufficient documentation

## 2018-12-15 DIAGNOSIS — L709 Acne, unspecified: Secondary | ICD-10-CM | POA: Insufficient documentation

## 2018-12-17 ENCOUNTER — Telehealth (INDEPENDENT_AMBULATORY_CARE_PROVIDER_SITE_OTHER): Admitting: Neurology

## 2018-12-17 ENCOUNTER — Other Ambulatory Visit: Payer: Self-pay

## 2018-12-17 DIAGNOSIS — G40009 Localization-related (focal) (partial) idiopathic epilepsy and epileptic syndromes with seizures of localized onset, not intractable, without status epilepticus: Secondary | ICD-10-CM | POA: Diagnosis not present

## 2018-12-17 MED ORDER — LEVETIRACETAM 500 MG PO TABS: ORAL_TABLET | ORAL | 11 refills | Status: DC
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
## 2018-12-24 ENCOUNTER — Other Ambulatory Visit: Payer: Self-pay

## 2018-12-24 DIAGNOSIS — G40009 Localization-related (focal) (partial) idiopathic epilepsy and epileptic syndromes with seizures of localized onset, not intractable, without status epilepticus: Secondary | ICD-10-CM

## 2018-12-24 MED ORDER — LEVETIRACETAM 500 MG PO TABS: ORAL_TABLET | ORAL | 11 refills | Status: AC

## 2018-12-31 ENCOUNTER — Telehealth: Payer: Self-pay | Admitting: *Deleted

## 2018-12-31 NOTE — Telephone Encounter (Signed)
LMOM to call us to schedule EEG appointment. This is the 3rd call and message left for him to call us back.

## 2019-01-11 ENCOUNTER — Other Ambulatory Visit: Payer: Self-pay

## 2019-01-11 ENCOUNTER — Ambulatory Visit (INDEPENDENT_AMBULATORY_CARE_PROVIDER_SITE_OTHER): Admitting: Neurology

## 2019-01-11 DIAGNOSIS — G40009 Localization-related (focal) (partial) idiopathic epilepsy and epileptic syndromes with seizures of localized onset, not intractable, without status epilepticus: Secondary | ICD-10-CM | POA: Diagnosis not present

## 2019-01-19 ENCOUNTER — Telehealth: Payer: Self-pay

## 2019-01-19 NOTE — Telephone Encounter (Signed)
-----   Message from Cameron Sprang, MD sent at 01/19/2019  8:18 AM EDT ----- Pls let him know the brain wave test was normal. Proceed with MRI brain as discussed, when is he scheduled? Thanks

## 2019-01-19 NOTE — Procedures (Signed)
ELECTROENCEPHALOGRAM REPORT  Date of Study: 01/11/2019  Patient's Name: Martin Daniel MRN: [id number] Date of Birth: 1976-03-14  Referring Provider: Dr. Ellouise Newer  Clinical History: This is a 43 year old man with  recurrent seizures starting with visual symptoms followed by convulsive activity.  Medications: NEURONTIN 800 MG tablet KEPPRA 500 MG tablet BUSPAR 10 MG tablet ZYRTEC 10 MG tablet Cholecalciferol 2000 UNITS TABS PRISTIQ 100 MG 24 hr tablet FLONASE 50 MCG/ACT nasal spray CYTOMEL 5 MCG tablet methylphenidate 54 MG PO CR tablet NASONEX 50 MCG/ACT nasal spray MULTIVITAMIN WITH MINERALS TABS  K-DUR 20 MEQ tablet SEROQUEL 200 MG tablet DEPOTESTOTERONE CYPIONATE 200 MG/ML injection  Technical Summary: A multichannel digital 1-hour EEG recording measured by the international 10-20 system with electrodes applied with paste and impedances below 5000 ohms performed in our laboratory with EKG monitoring in an awake and asleep patient.  Hyperventilation was not performed. Photic stimulation was performed.  The digital EEG was referentially recorded, reformatted, and digitally filtered in a variety of bipolar and referential montages for optimal display.    Description: The patient is awake and asleep during the recording.  During maximal wakefulness, there is a symmetric, medium voltage 9 Hz posterior dominant rhythm that attenuates with eye opening.  The record is symmetric.  During drowsiness and Stage I sleep, there is an increase in theta slowing of the background with rare vertex waves seen.  Photic stimulation did not elicit any abnormalities.  There were no epileptiform discharges or electrographic seizures seen.    EKG lead was unremarkable.  Impression: This 1-hour awake and asleep EEG is normal.    Clinical Correlation: A normal EEG does not exclude a clinical diagnosis of epilepsy.  If further clinical questions remain, prolonged EEG may be helpful.  Clinical  correlation is advised.   Ellouise Newer, M.D.

## 2019-01-19 NOTE — Telephone Encounter (Signed)
Pt informed of normal results. Pt has MRI scheduled for 7/28

## 2019-02-02 ENCOUNTER — Other Ambulatory Visit

## 2019-02-26 ENCOUNTER — Ambulatory Visit
Admission: RE | Admit: 2019-02-26 | Discharge: 2019-02-26 | Disposition: A | Discharge status: Home / Self Care | Source: Ambulatory Visit | Attending: Neurology | Admitting: Neurology

## 2019-02-26 ENCOUNTER — Other Ambulatory Visit: Payer: Self-pay

## 2019-02-26 DIAGNOSIS — G40009 Localization-related (focal) (partial) idiopathic epilepsy and epileptic syndromes with seizures of localized onset, not intractable, without status epilepticus: Secondary | ICD-10-CM

## 2019-02-26 MED ORDER — GADOBENATE DIMEGLUMINE 529 MG/ML IV SOLN
15.0000 mL | Freq: Once | INTRAVENOUS | Status: AC | PRN
  Administered 2019-02-26: 15 mL via INTRAVENOUS

## 2019-03-02 ENCOUNTER — Telehealth: Payer: Self-pay

## 2019-03-02 NOTE — Telephone Encounter (Signed)
Pt informed of normal MRI results.  Tried asking pt about seizures. He states that he is getting ready to walk into court that he would need to call me back.

## 2019-03-02 NOTE — Telephone Encounter (Signed)
-----   Message from Cameron Sprang, MD sent at 03/01/2019  8:42 AM EDT ----- Pls let him know the MRI brain looked good, normal, no evidence of tumor, stroke, or bleed. Is he still having seizures on the higher dose of Keppra 500mg  1.5 tabs BID? Thanks

## 2019-03-09 NOTE — Telephone Encounter (Signed)
Patient was returning your call about results.

## 2019-03-09 NOTE — Telephone Encounter (Signed)
Pt informed again of normal MRI results.  He is taking Keppra. He feels that he is having the same amount of seizures. He states that he has 1 seizure every 2-3 months.

## 2019-03-12 ENCOUNTER — Telehealth: Payer: Self-pay | Admitting: Neurology

## 2019-03-12 NOTE — Telephone Encounter (Signed)
Patient called and left a message with the answering service stating:  Caller says that he is waiting to hear back on a med for seizures. Not sure if they will change his med from what the [place] has him on. He recently had an MRI and EKG.

## 2019-03-16 NOTE — Telephone Encounter (Signed)
Increase to 1000mg  BID for now, thanks

## 2019-03-16 NOTE — Telephone Encounter (Signed)
Dr. Delice Lesch,  When I last spoke to pt he was having 1 seizure every 2-3 months. Do you want to change his Keppra. His taking it BID.

## 2019-03-17 NOTE — Telephone Encounter (Signed)
Will the [place] make his anxiety worse?

## 2019-03-17 NOTE — Telephone Encounter (Signed)
Pt is very concerned about increasing medication.  I went over some of the side effects and he got very anxious. Pt states that his Dr took away his Benzo's and his anxiety has become worse.  He kept asking what is wrong with his brain, what is wrong with his memory. Did we check for TBI?  Questioning if Keppra safe to increase and can we start him on an anxiety medication?  [place]

## 2019-03-17 NOTE — Telephone Encounter (Signed)
The MRI brain did not show any permanent scar tissue from head injury in the past, but patients can have TBI and normal brain scans. We can do Neurocognitive testing to further evaluate his memory, if he wishes. We do not treat anxiety, he has to speak to his PCP or psychiatrist at the [facility] for this. Increasing the Keppra dose can help potentially reduce his seizures.

## 2019-03-18 NOTE — Telephone Encounter (Signed)
Left message informing pt of all and to call back if he wished to schedule Neurocognitive testing.

## 2019-03-18 NOTE — Telephone Encounter (Signed)
Not necessarily, in some people it can, but if it helps with his seizures so he is not anxious of having another one, then it will help more than hurt.

## 2019-04-06 ENCOUNTER — Telehealth: Payer: Self-pay | Admitting: Neurology

## 2019-04-06 NOTE — Telephone Encounter (Signed)
Marcie Bal from the [facility] is calling in wanting to get some info on the MRI that was supposed to be ordered for this patient. They are unaware of where this was to be taken place and she is wanting to get it all authorized thru the [place]. Please call her back at [phone] ext [phone]. Thanks!

## 2019-04-08 NOTE — Telephone Encounter (Signed)
Call returned to [place]. Patient does not have any order for a MRI from our office. (Did have on 02/26/19). Left message on [phone]) that this office does not have an order for patient to have a future MRI.

## 2019-04-21 ENCOUNTER — Ambulatory Visit (HOSPITAL_COMMUNITY)
Admission: AD | Admit: 2019-04-21 | Discharge: 2019-04-21 | Disposition: A | Discharge status: Home / Self Care | Attending: Psychiatry | Admitting: Psychiatry

## 2019-04-21 ENCOUNTER — Encounter (HOSPITAL_COMMUNITY): Payer: Self-pay | Admitting: Behavioral Health

## 2019-04-21 DIAGNOSIS — Z20828 Contact with and (suspected) exposure to other viral communicable diseases: Secondary | ICD-10-CM | POA: Diagnosis not present

## 2019-04-21 DIAGNOSIS — R45851 Suicidal ideations: Secondary | ICD-10-CM | POA: Insufficient documentation

## 2019-04-21 LAB — SARS CORONAVIRUS 2 BY RT PCR (HOSPITAL ORDER, PERFORMED IN ~~LOC~~ HOSPITAL LAB): SARS Coronavirus 2: NEGATIVE

## 2019-04-21 MED ORDER — TEMAZEPAM 30 MG PO CAPS: 30.0000 mg | ORAL_CAPSULE | Freq: Every day | ORAL | Status: DC

## 2019-04-21 MED ORDER — QUETIAPINE FUMARATE 200 MG PO TABS
200.0000 mg | ORAL_TABLET | Freq: Every day | ORAL | Status: DC
  Filled 2019-04-21 (×2): qty 1

## 2019-04-21 MED ORDER — VENLAFAXINE HCL ER 150 MG PO CP24
150.0000 mg | ORAL_CAPSULE | Freq: Every day | ORAL | Status: DC
  Filled 2019-04-21 (×2): qty 1

## 2019-04-21 MED ORDER — POTASSIUM CHLORIDE CRYS ER 20 MEQ PO TBCR
20.0000 meq | EXTENDED_RELEASE_TABLET | Freq: Every day | ORAL | Status: DC
  Filled 2019-04-21 (×3): qty 1

## 2019-04-21 MED ORDER — AMLODIPINE BESYLATE 10 MG PO TABS
10.0000 mg | ORAL_TABLET | Freq: Every day | ORAL | Status: DC
  Filled 2019-04-21 (×3): qty 1

## 2019-04-21 MED ORDER — LEVETIRACETAM 750 MG PO TABS
750.0000 mg | ORAL_TABLET | Freq: Two times a day (BID) | ORAL | Status: DC
  Filled 2019-04-21 (×4): qty 1

## 2019-04-21 MED ORDER — TEMAZEPAM 30 MG PO CAPS
30.0000 mg | ORAL_CAPSULE | Freq: Every evening | ORAL | Status: DC | PRN
  Filled 2019-04-21: qty 1

## 2019-04-21 MED ORDER — BUSPIRONE HCL 15 MG PO TABS
15.0000 mg | ORAL_TABLET | Freq: Three times a day (TID) | ORAL | Status: DC
  Filled 2019-04-21 (×6): qty 1

## 2019-04-21 MED ORDER — CLONIDINE HCL 0.2 MG PO TABS
0.2000 mg | ORAL_TABLET | Freq: Three times a day (TID) | ORAL | Status: DC
  Filled 2019-04-21 (×6): qty 1

## 2019-04-21 MED ORDER — GABAPENTIN 400 MG PO CAPS
800.0000 mg | ORAL_CAPSULE | Freq: Three times a day (TID) | ORAL | Status: DC
  Filled 2019-04-21 (×6): qty 2

## 2019-04-21 NOTE — Progress Notes (Signed)
Progress note  Pt came to the unit IVC'd. Pt present with shift change. Pt has been resting since being assessed by counselor and nurse practitioner. Pt was awakened for Covid test and complied willingly. Pt discussed collectibles they have before falling back to sleep. Pt still resting. Pt is pleasant during contact. Pt denies si/hi/ah/vh at this time and verbally agrees to approach staff if these become apparent or before harming himself/others while at [place].

## 2019-04-21 NOTE — BH Assessment (Signed)
[place] Assessment Progress Note  Per Virgina Evener, MD, this pt requires psychiatric hospitalization at this time.  Pt presents under IVC initiated by pt's mother, which Dr Jake Samples has upheld.  At 16:35, Leron Croak calls from [facility] to report that pt has been accepted to their facility by Dr Jari Favre, to the [facility].  Mordecai Maes, NP concurs with this decision.  Pt's nurse, Shafiq, has been notified, and agrees to call report to [phone].  Pt is to be transported via [facility].  [facility] may be reached at [phone].  [place] Coordinator [phone]
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
## 2019-04-21 NOTE — BHH Counselor (Signed)
Contacted [facility], asked TTS to review Pt for placement.  TTS advised they will review and call.
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
## 2019-04-30 ENCOUNTER — Other Ambulatory Visit: Payer: Self-pay

## 2019-04-30 ENCOUNTER — Emergency Department (HOSPITAL_COMMUNITY)
Admission: EM | Admit: 2019-04-30 | Discharge: 2019-05-01 | Disposition: A | Discharge status: Home / Self Care | Attending: Emergency Medicine | Admitting: Emergency Medicine

## 2019-04-30 DIAGNOSIS — R55 Syncope and collapse: Secondary | ICD-10-CM

## 2019-04-30 DIAGNOSIS — Z87891 Personal history of nicotine dependence: Secondary | ICD-10-CM | POA: Insufficient documentation

## 2019-04-30 DIAGNOSIS — R7989 Other specified abnormal findings of blood chemistry: Secondary | ICD-10-CM

## 2019-04-30 DIAGNOSIS — E860 Dehydration: Secondary | ICD-10-CM | POA: Diagnosis not present

## 2019-04-30 DIAGNOSIS — Z79899 Other long term (current) drug therapy: Secondary | ICD-10-CM | POA: Diagnosis not present

## 2019-04-30 DIAGNOSIS — R42 Dizziness and giddiness: Secondary | ICD-10-CM | POA: Diagnosis not present

## 2019-04-30 DIAGNOSIS — I959 Hypotension, unspecified: Secondary | ICD-10-CM | POA: Diagnosis present

## 2019-04-30 LAB — MAGNESIUM: Magnesium: 2.4 mg/dL (ref 1.7–2.4)

## 2019-04-30 LAB — BASIC METABOLIC PANEL
Anion gap: 7 (ref 5–15)
BUN: 15 mg/dL (ref 6–20)
CO2: 23 mmol/L (ref 22–32)
Calcium: 8.9 mg/dL (ref 8.9–10.3)
Chloride: 108 mmol/L (ref 98–111)
Creatinine, Ser: 1.51 mg/dL — ABNORMAL HIGH (ref 0.61–1.24)
GFR calc Af Amer: >60 mL/min (ref >60)
GFR calc non Af Amer: 56 mL/min — ABNORMAL LOW (ref >60)
Glucose, Bld: 111 mg/dL — ABNORMAL HIGH (ref 70–99)
Potassium: 4.1 mmol/L (ref 3.5–5.1)
Sodium: 138 mmol/L (ref 135–145)

## 2019-04-30 LAB — CBC WITH DIFFERENTIAL/PLATELET
Abs Immature Granulocytes: 0.09 10*3/uL — ABNORMAL HIGH (ref 0.00–0.07)
Basophils Absolute: 0.1 10*3/uL (ref 0.0–0.1)
Basophils Relative: 0 %
Eosinophils Absolute: 0 10*3/uL (ref 0.0–0.5)
Eosinophils Relative: 0 %
HCT: 54.3 % — ABNORMAL HIGH (ref 39.0–52.0)
Hemoglobin: 17.4 g/dL — ABNORMAL HIGH (ref 13.0–17.0)
Immature Granulocytes: 1 %
Lymphocytes Relative: 5 %
Lymphs Abs: 0.9 10*3/uL (ref 0.7–4.0)
MCH: 29.9 pg (ref 26.0–34.0)
MCHC: 32 g/dL (ref 30.0–36.0)
MCV: 93.3 fL (ref 80.0–100.0)
Monocytes Absolute: 1 10*3/uL (ref 0.1–1.0)
Monocytes Relative: 6 %
Neutro Abs: 15.5 10*3/uL — ABNORMAL HIGH (ref 1.7–7.7)
Neutrophils Relative %: 88 %
Platelets: 286 10*3/uL (ref 150–400)
RBC: 5.82 MIL/uL — ABNORMAL HIGH (ref 4.22–5.81)
RDW: 13.3 % (ref 11.5–15.5)
WBC: 17.6 10*3/uL — ABNORMAL HIGH (ref 4.0–10.5)
nRBC: 0 % (ref 0.0–0.2)

## 2019-04-30 LAB — ETHANOL: Alcohol, Ethyl (B): <10 mg/dL (ref <10)

## 2019-04-30 LAB — TROPONIN I (HIGH SENSITIVITY): Troponin I (High Sensitivity): 3 ng/L (ref <18)

## 2019-04-30 LAB — CBG MONITORING, ED: Glucose-Capillary: 83 mg/dL (ref 70–99)

## 2019-04-30 MED ORDER — SODIUM CHLORIDE 0.9 % IV BOLUS
1000.0000 mL | Freq: Once | INTRAVENOUS | Status: AC
  Administered 2019-04-30: 1000 mL via INTRAVENOUS

## 2019-04-30 NOTE — ED Triage Notes (Signed)
43 yo male brought in by Ems. Ems called by employee of [employer], stated that patient passed out. Pts bp 80/40 upon arrival of EMS. Pt given 500 bag of fluid and pressure still 88/58, sinus tach with pvc on monitor. Alert and oriented follows command but speech is slurred as per EMS. EMS also states that pt has a box of medications with him. 18 gauge in Left AC.  EMS took pocket knife off of patient and placed it in his box of medications.  No cbg [id number] rr 18 spo2- 98%

## 2019-04-30 NOTE — ED Notes (Signed)
Pt requested to walk to the restroom. Provided pt with bedside commode instead as he is hypotensive.

## 2019-04-30 NOTE — ED Provider Notes (Signed)
[facility]-EMERGENCY DEPT Provider Note   CSN: [id number] Arrival date & time: 04/30/19  5956     History   Chief Complaint Chief Complaint  Patient presents with  . Hypotension  . Dizziness    HPI Martin Daniel is a 43 y.o. male with a history of seizures, on Keppra, PTSD, anxiety, presenting to emergency department with near syncope.  Patient reports that he was at the strip club tonight.  He had some drinks and was sitting at the bar when he began to feel lightheaded.  He felt like he was going to pass out.  He is unsure if he lost consciousness.  He states he does not feel like he had a seizure.  He remembers telling one of the club members to "take me outside of the parking lot because I don't want to seize in front of everyone."   He says that he was "dragged" out of the club into the parking lot.  He remembers all this but says he felt very woozy and weak. He does not believe he had a seizure, but says, "Normally my muscles all ache and I'm not feeling that." He then called EMS.  EMS reports patient had a blood pressure of 80/40 upon arrival.  They gave him 1/2 L of fluid with improvement in his blood pressure.  The patient was awake and oriented for them. EMS did not check blood sugar.  His heart rate was 106.  Patient adamantly denies to me that he took any opioids or used any recreational drugs.   In the ED, he continues to feels somewhat lightheaded when he attempts to stand up, but otherwise feels hungry and at his baseline.  He denies any history of diabetes. He states he is taking Keppra for seizures and has been compliant with this..  He denies any incontinence.     HPI  Past Medical History:  Diagnosis Date  . Anxiety   . Arthritis   . Chronic pain   . Nerve damage    lower abd and lower back  . OSA (obstructive sleep apnea)   . PTSD (post-traumatic stress disorder)     Patient Active Problem List   Diagnosis Date Noted  . Acne  12/15/2018  . Allergic rhinitis 12/15/2018  . Chronic pain 12/15/2018  . Depression 12/15/2018  . Hypercholesterolemia 12/15/2018  . Hypotestosteronism 12/15/2018  . 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 Insomnia 02/28/2016  . OSA (obstructive sleep apnea) 02/28/2016  . Acute encephalopathy 03/06/2014  . PTSD (post-traumatic stress disorder) 03/06/2014    Past Surgical History:  Procedure Laterality Date  . APPENDECTOMY    . VASECTOMY          Home Medications    Prior to Admission medications   Medication Sig Start Date End Date Taking? Authorizing Provider  amLODipine (NORVASC) 5 MG tablet Take 5 mg by mouth daily.     [provider]  artificial tears (LACRILUBE) OINT ophthalmic ointment Place into the right eye every 3 (three) hours as needed for dry eyes. 05/10/18   Wynetta Fines, MD  busPIRone (BUSPAR) 10 MG tablet Take 10 mg by mouth daily.    [provider]  cetirizine (ZYRTEC) 10 MG tablet Take 10 mg by mouth daily.    [provider]  Cholecalciferol 2000 UNITS TABS Take 1 tablet by mouth every morning.    [provider]  desvenlafaxine (PRISTIQ) 100 MG 24 hr tablet Take 300 mg by mouth daily.  [provider]  fluticasone (FLONASE) 50 MCG/ACT nasal spray Place into both nostrils daily.    [provider]  gabapentin (NEURONTIN) 800 MG tablet Take 800 mg by mouth 3 (three) times daily.     [provider]  levETIRAcetam (KEPPRA) 500 MG tablet Take 1.5 tablets twice a day 12/24/18   Cameron Sprang, MD  liothyronine (CYTOMEL) 5 MCG tablet Take 5 mcg by mouth daily.    [provider]  methylphenidate 54 MG PO CR tablet Take 108 mg by mouth every morning.     [provider]  mirtazapine (REMERON) 45 MG tablet Take 45 mg by mouth at bedtime.    [provider]  mometasone (NASONEX) 50 MCG/ACT nasal spray Place into the nose. 08/24/12   [provider]   Multiple Vitamin (MULTIVITAMIN WITH MINERALS) TABS tablet Take 1 tablet by mouth daily.    [provider]  polyvinyl alcohol (LIQUIFILM TEARS) 1.4 % ophthalmic solution Place 1 drop into both eyes 4 (four) times daily as needed for dry eyes.    [provider]  potassium chloride SA (K-DUR) 20 MEQ tablet Take 20 mEq by mouth daily.    [provider]  QUEtiapine (SEROQUEL) 200 MG tablet Take 200 mg by mouth at bedtime.    [provider]  testosterone cypionate (DEPOTESTOTERONE CYPIONATE) 200 MG/ML injection Inject 200 mg into the muscle every Wednesday.    [provider]    Family History Family History  Family history unknown: Yes    Social History Social History   Tobacco Use  . Smoking status: Former Smoker    Packs/day: 1.00    Years: 15.00    Pack years: 15.00    Types: Cigarettes    Quit date: 07/08/2012    Years since quitting: 6.8  . Smokeless tobacco: Current User    Types: Snuff  . Tobacco comment: Snuff since 2007  Substance Use Topics  . Alcohol use: No    Alcohol/week: 0.0 standard drinks    Comment: QUIT 2007  . Drug use: No    Comment: Pt denied     Allergies   Pregabalin, Diphenhydramine, and Trazodone and nefazodone   Review of Systems Review of Systems  Constitutional: Negative for chills and fever.  Eyes: Negative for pain and visual disturbance.  Respiratory: Negative for cough and shortness of breath.   Cardiovascular: Negative for chest pain and palpitations.  Gastrointestinal: Negative for abdominal pain, nausea and vomiting.  Genitourinary: Negative for dysuria and hematuria.  Skin: Negative for color change and rash.  Neurological: Positive for dizziness and light-headedness. Negative for seizures, syncope, speech difficulty and headaches.  All other systems reviewed and are negative.    Physical Exam Updated Vital Signs BP 107/70   Pulse 76   Temp 98 F (36.7 C) (Oral)   Resp 11   SpO2  93%   Physical Exam Vitals signs and nursing note reviewed.  Constitutional:      Appearance: He is well-developed.  HENT:     Head: Normocephalic and atraumatic.  Neck:     Musculoskeletal: Neck supple.  Cardiovascular:     Rate and Rhythm: Normal rate and regular rhythm.     Pulses: Normal pulses.     Heart sounds: No murmur.  Pulmonary:     Effort: Pulmonary effort is normal. No respiratory distress.     Breath sounds: Normal breath sounds.  Abdominal:     General: There is no distension.  Palpations: Abdomen is soft.     Tenderness: There is no abdominal tenderness.  Skin:    General: Skin is warm and dry.     Comments: Multiple tattoos   Neurological:     General: No focal deficit present.     Mental Status: He is alert and oriented to person, place, and time.     Cranial Nerves: No cranial nerve deficit.     Sensory: No sensory deficit.     Motor: No weakness.      ED Treatments / Results  Labs (all labs ordered are listed, but only abnormal results are displayed) Labs Reviewed  BASIC METABOLIC PANEL - Abnormal; Notable for the following components:      Result Value   Glucose, Bld 111 (*)    Creatinine, Ser 1.51 (*)    GFR calc non Af Amer 56 (*)    All other components within normal limits  CBC WITH DIFFERENTIAL/PLATELET - Abnormal; Notable for the following components:   WBC 17.6 (*)    RBC 5.82 (*)    Hemoglobin 17.4 (*)    HCT 54.3 (*)    Neutro Abs 15.5 (*)    Abs Immature Granulocytes 0.09 (*)    All other components within normal limits  MAGNESIUM  ETHANOL  CBG MONITORING, ED  TROPONIN I (HIGH SENSITIVITY)    EKG EKG Interpretation  Date/Time:  Friday April 30 2019 19:48:44 EDT Ventricular Rate:  91 PR Interval:    QRS Duration: 110 QT Interval:  363 QTC Calculation: 447 R Axis:   53 Text Interpretation:  Abnormal R-wave progression, early transition Baseline wander in lead(s) V1 Normal sinus rhythm No STEMI  Confirmed by Alvester Chourifan,  Aspasia Rude [id number]) on 04/30/2019 7:56:48 PM   Radiology No results found.  Procedures Procedures (including critical care time)  Medications Ordered in ED Medications  sodium chloride 0.9 % bolus 1,000 mL (0 mLs Intravenous Stopped 05/01/19 0314)     Initial Impression / Assessment and Plan / ED Course  I have reviewed the triage vital signs and the nursing notes.  Pertinent labs & imaging results that were available during my care of the patient were reviewed by me and considered in my medical decision making (see chart for details).  43 yo male presenting with near-syncopal episode earlier this evening.  Found to be hypotensive on scene and given some fluids with improvement.  Here he feels better but continues to feel woozy if standing up.  He reports poor fluid intake, and collectively this is most likely indicative of dehydration and orthostatic hypotension. We will give fluids here.  His accucheck here is wnl.  He is hungry - will give food as well.  We'll check ecg, troponin Electrolytes, hgb level  He has no signs of symptoms to suggest infection, sepsis or COVID-19.  His history is not suggestive of seizure to me.  Clinical Course as of Apr 30 1016  Fri Apr 30, 2019  2342 Feeling better, advised to stay hydrated, will discharge   [MT]    Clinical Course User Index [MT] Ramez Arrona, Kermit BaloMatthew J, MD     Final Clinical Impressions(s) / ED Diagnoses   Final diagnoses:  Dehydration  Near syncope  Elevated serum creatinine    ED Discharge Orders    None       Terald Sleeperrifan, Almando Brawley J, MD 05/01/19 1022

## 2019-04-30 NOTE — ED Notes (Signed)
ED Provider at bedside. 

## 2019-05-01 NOTE — ED Notes (Signed)
Pt left before orthostatic pressures could be completed

## 2019-08-29 IMAGING — MR MRI HEAD WITHOUT AND WITH CONTRAST
14 series · 48 of 48 positions shown · IV contrast (15ml multihance)
Comparison: CT head without contrast 05/11/2018 and 03/06/2014

CLINICAL DATA: Seizures. Memory loss. Anxiety. Insomnia. Difficulty
walking.

EXAM:
MRI HEAD WITHOUT AND WITH CONTRAST
TECHNIQUE: Multiplanar, multiecho pulse sequences of the brain and surrounding
structures were obtained without and with intravenous contrast.
CONTRAST:  15mL MULTIHANCE GADOBENATE DIMEGLUMINE 529 MG/ML IV SOLN

[Series 2: T1 · sagittal · 5.0mm · 0.45mm/px · 1 of 23 slices shown]
[im 1/23]
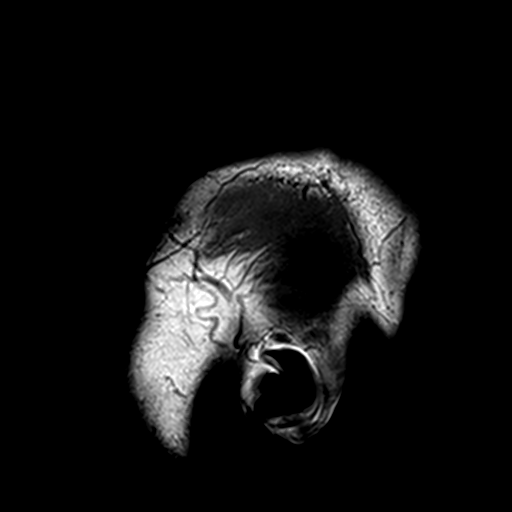

[Series 3: DWI · axial · 3.0mm · 1.80mm/px · z∈[-55,+90]mm · 6 of 100 slices shown (1 of 4)]
[im 1/100]
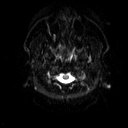
[im 20/100]
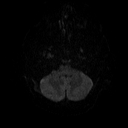
[im 40/100]
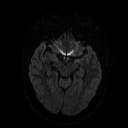
[im 60/100]
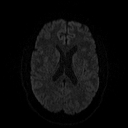
[im 80/100]
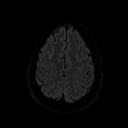
[im 100/100]
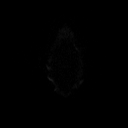

[Series 4: DWI · axial · 3.0mm · 1.80mm/px · z∈[-55,+90]mm · 3 of 50 slices shown (2 of 4)]
[im 1/50]
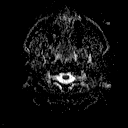
[im 25/50]
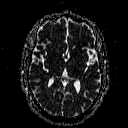
[im 50/50]
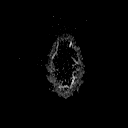

[Series 5: DWI · coronal · 5.0mm · 1.80mm/px · 5 of 74 slices shown (3 of 4)]
[im 1/74]
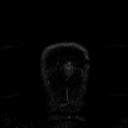
[im 19/74]
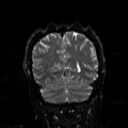
[im 37/74]
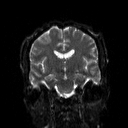
[im 55/74]
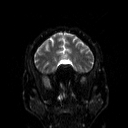
[im 74/74]
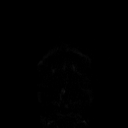

[Series 6: DWI · coronal · 5.0mm · 1.80mm/px · 2 of 37 slices shown (4 of 4)]
[im 1/37]
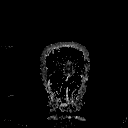
[im 37/37]
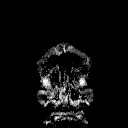

[Series 7: T2 · axial · 5.0mm · 0.51mm/px · 1 of 22 slices shown (1 of 3)]
[im 1/22]
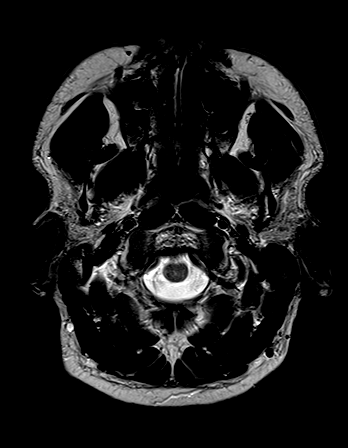

[Series 8: FLAIR · axial · 3.0mm · 0.45mm/px · z∈[-58,+86]mm · 2 of 32 slices shown (1 of 2)]
[im 1/32]
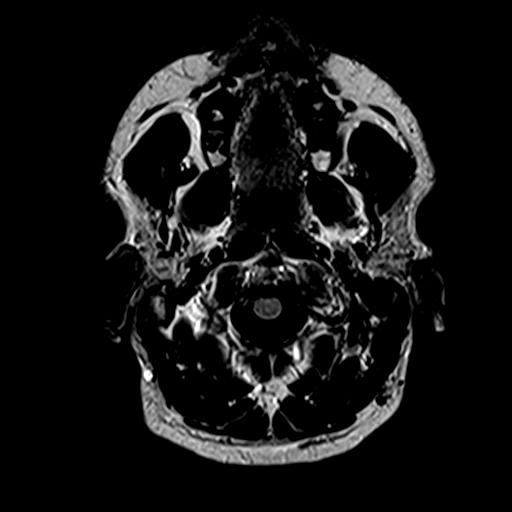
[im 32/32]
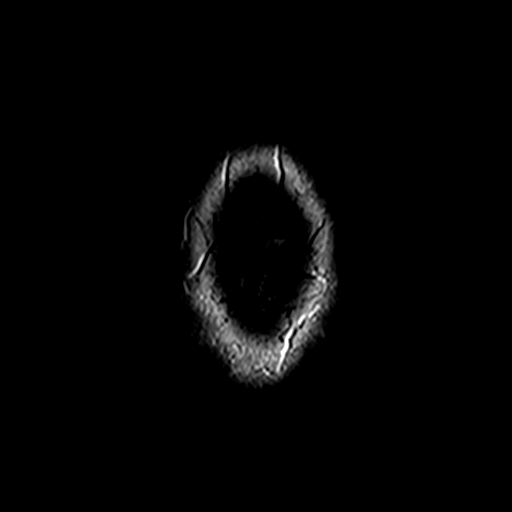

[Series 10: swi_images · axial · 4.0mm · 0.90mm/px · z∈[-68,+87]mm · 2 of 40 slices shown]
[im 1/40]
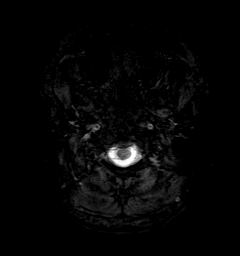
[im 40/40]
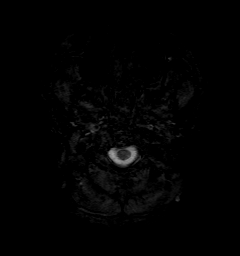

[Series 11: t1_mpr_tra · axial · 1.0mm · 0.75mm/px · z∈[-62,+85]mm · 9 of 144 slices shown (1 of 2)]
[im 1/144]
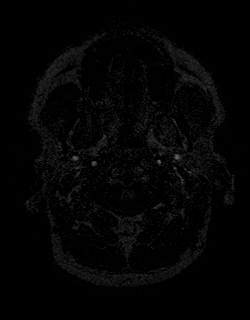
[im 18/144]
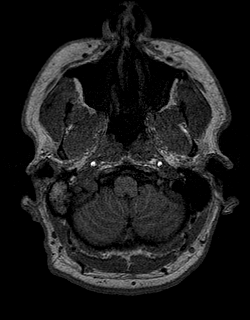
[im 36/144]
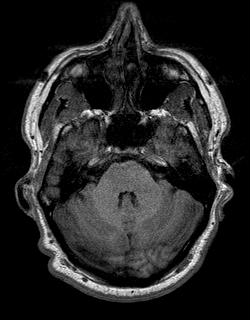
[im 54/144]
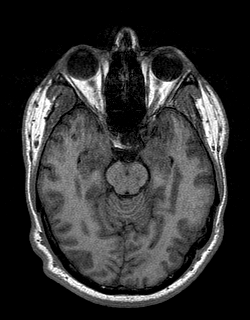
[im 72/144]
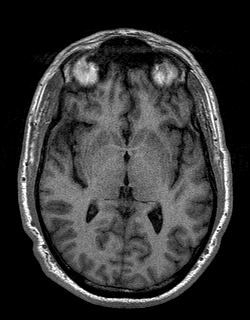
[im 90/144]
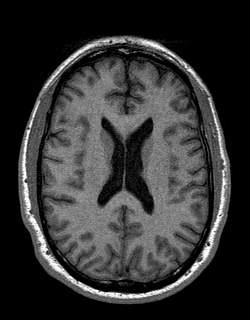
[im 108/144]
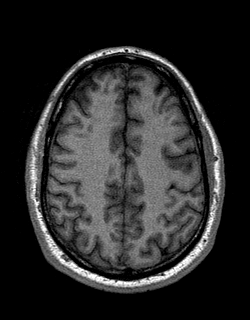
[im 126/144]
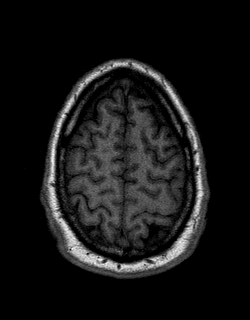
[im 144/144]
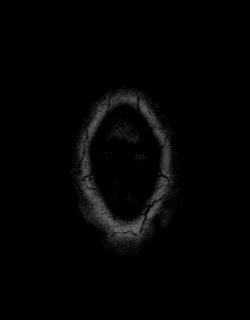

[Series 12: T2 · coronal · 3.0mm · 0.28mm/px · 2 of 28 slices shown (2 of 3)]
[im 1/28]
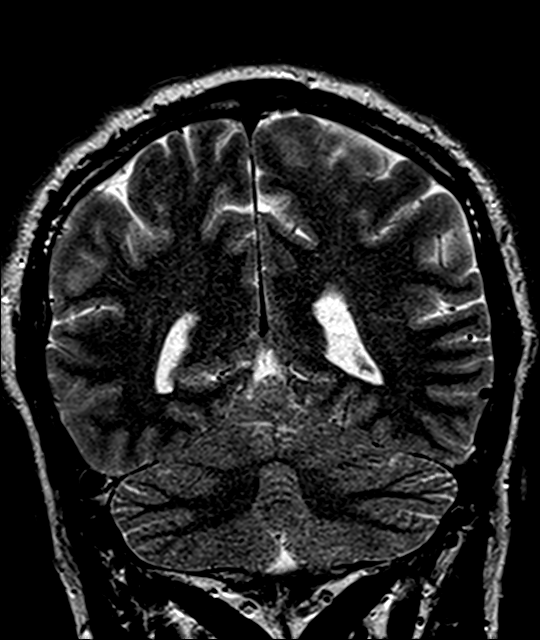
[im 28/28]
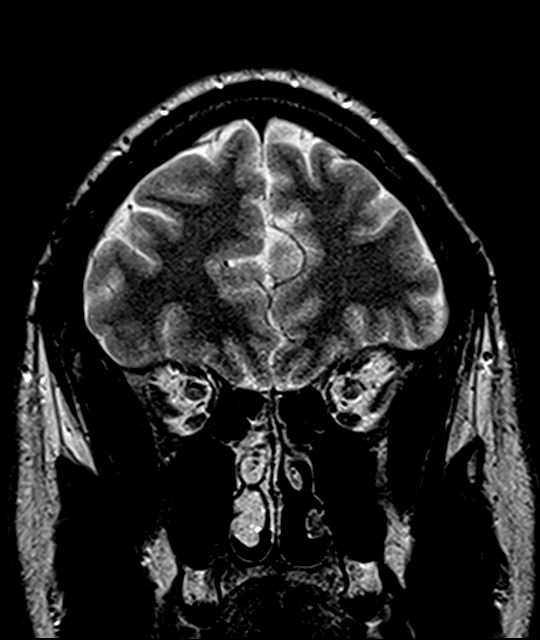

[Series 13: FLAIR · coronal · 3.0mm · 0.70mm/px · 2 of 28 slices shown (2 of 2)]
[im 1/28]
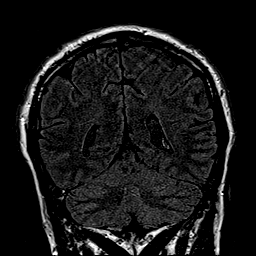
[im 28/28]
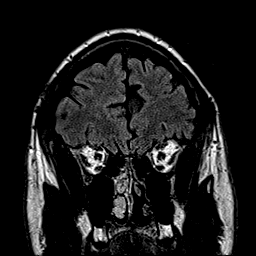

[Series 14: T2 · coronal · 5.0mm · 0.45mm/px · 2 of 28 slices shown (3 of 3)]
[im 1/28]
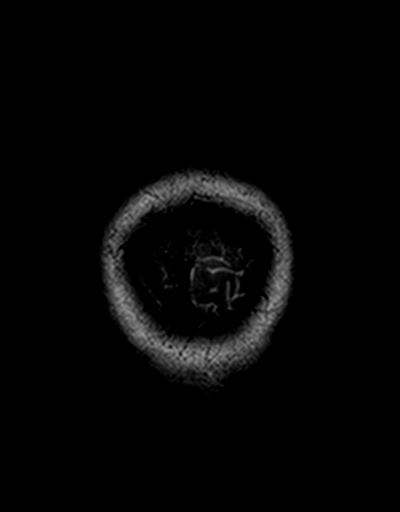
[im 28/28]
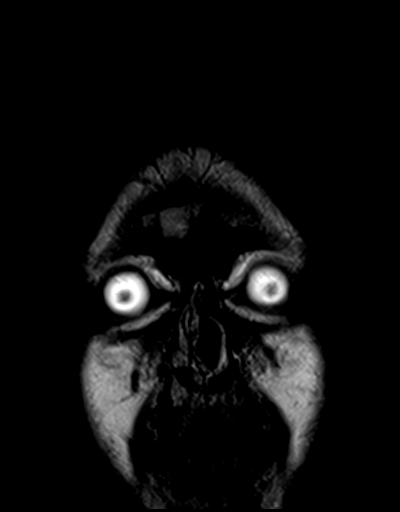

[Series 15: t1_mpr_tra · axial · 1.0mm · 0.75mm/px · z∈[-62,+85]mm · 9 of 144 slices shown (2 of 2)]
[im 1/144]
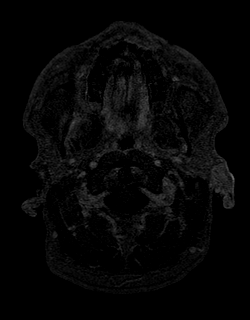
[im 18/144]
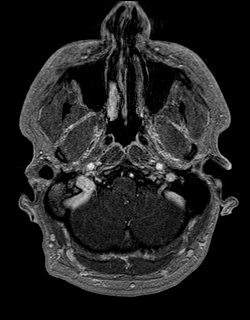
[im 36/144]
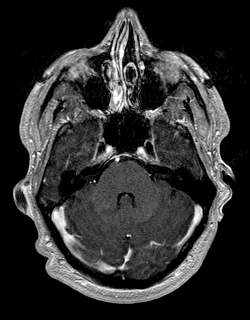
[im 54/144]
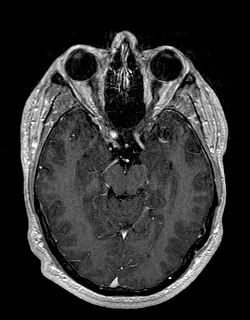
[im 72/144]
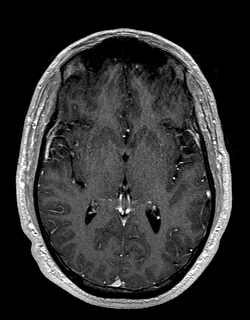
[im 90/144]
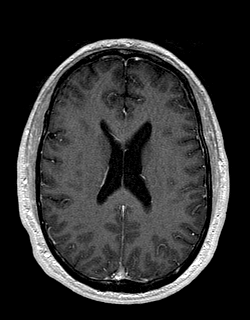
[im 108/144]
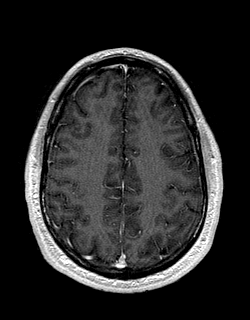
[im 126/144]
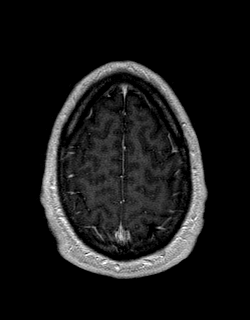
[im 144/144]
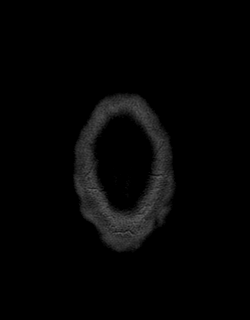

[Series 16: post cor · coronal · 5.0mm · 0.45mm/px · 2 of 28 slices shown]
[im 1/28]
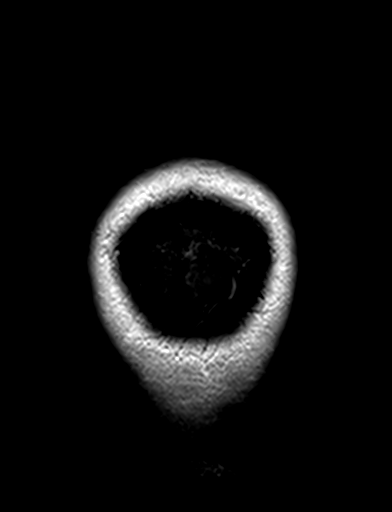
[im 28/28]
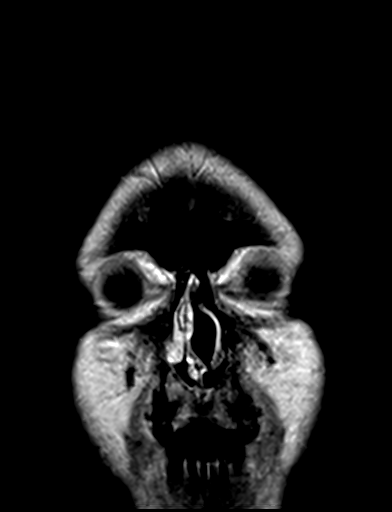

[48 of 48 positions shown; findings below may reference images not displayed]

FINDINGS: Brain: No acute infarct, hemorrhage, or mass lesion is present. No
significant white matter lesions are present. The ventricles are of
normal size. No significant extraaxial fluid collection is present.

Dedicated imaging of the temporal lobes demonstrates symmetric size
and signal of the hippocampus.

The internal auditory canals are within normal limits. The brainstem
and cerebellum are within normal limits.

Postcontrast images demonstrate no pathologic enhancement.

Vascular: Flow is present in the major intracranial arteries.

Skull and upper cervical spine: The craniocervical junction is
normal. Upper cervical spine is within normal limits. Marrow signal
is unremarkable. Midline structures are within normal limits

Sinuses/Orbits: The right mastoid air cells are not pneumatized. The
paranasal sinuses and mastoid air cells are otherwise clear. The
globes and orbits are within normal limits.
IMPRESSION: Normal MRI of the brain without and with contrast. No acute or focal
lesion to explain seizures.

## 2020-03-08 DEATH — deceased
